# Patient Record
Sex: Male | Born: 1965 | Race: Black or African American | Hispanic: No | Marital: Single | Smoking: Current every day smoker
Health system: Southern US, Community
[De-identification: ages and names within clinical notes are randomized; demographics above are authoritative.]

## PROBLEM LIST (undated history)

## (undated) DIAGNOSIS — I10 Essential (primary) hypertension: Secondary | ICD-10-CM

## (undated) DIAGNOSIS — I2510 Atherosclerotic heart disease of native coronary artery without angina pectoris: Secondary | ICD-10-CM

## (undated) DIAGNOSIS — I429 Cardiomyopathy, unspecified: Secondary | ICD-10-CM

## (undated) DIAGNOSIS — N186 End stage renal disease: Secondary | ICD-10-CM

## (undated) DIAGNOSIS — E119 Type 2 diabetes mellitus without complications: Secondary | ICD-10-CM

## (undated) DIAGNOSIS — Z95810 Presence of automatic (implantable) cardiac defibrillator: Secondary | ICD-10-CM

## (undated) DIAGNOSIS — Z992 Dependence on renal dialysis: Secondary | ICD-10-CM

## (undated) DIAGNOSIS — E1140 Type 2 diabetes mellitus with diabetic neuropathy, unspecified: Secondary | ICD-10-CM

## (undated) HISTORY — PX: TRANSMETATARSAL AMPUTATION: SHX6197

## (undated) HISTORY — PX: INSERT / REPLACE / REMOVE PACEMAKER: SUR710

---

## 2015-11-04 ENCOUNTER — Encounter (HOSPITAL_COMMUNITY): Payer: Self-pay | Admitting: Internal Medicine

## 2016-01-08 ENCOUNTER — Encounter (HOSPITAL_COMMUNITY): Payer: Self-pay | Admitting: Emergency Medicine

## 2016-01-08 ENCOUNTER — Emergency Department (HOSPITAL_COMMUNITY): Payer: Medicare Other

## 2016-01-08 ENCOUNTER — Inpatient Hospital Stay (HOSPITAL_COMMUNITY)
Admission: EM | Admit: 2016-01-08 | Discharge: 2016-01-13 | DRG: 871 | Disposition: A | Discharge status: Skilled Nursing Facility | Payer: Medicare Other | Attending: Internal Medicine | Admitting: Internal Medicine

## 2016-01-08 DIAGNOSIS — A4102 Sepsis due to Methicillin resistant Staphylococcus aureus: Secondary | ICD-10-CM | POA: Diagnosis present

## 2016-01-08 DIAGNOSIS — I120 Hypertensive chronic kidney disease with stage 5 chronic kidney disease or end stage renal disease: Secondary | ICD-10-CM | POA: Diagnosis present

## 2016-01-08 DIAGNOSIS — A419 Sepsis, unspecified organism: Secondary | ICD-10-CM | POA: Diagnosis not present

## 2016-01-08 DIAGNOSIS — N186 End stage renal disease: Secondary | ICD-10-CM

## 2016-01-08 DIAGNOSIS — E1122 Type 2 diabetes mellitus with diabetic chronic kidney disease: Secondary | ICD-10-CM | POA: Diagnosis present

## 2016-01-08 DIAGNOSIS — I739 Peripheral vascular disease, unspecified: Secondary | ICD-10-CM | POA: Diagnosis present

## 2016-01-08 DIAGNOSIS — I10 Essential (primary) hypertension: Secondary | ICD-10-CM | POA: Diagnosis present

## 2016-01-08 DIAGNOSIS — R509 Fever, unspecified: Secondary | ICD-10-CM | POA: Diagnosis present

## 2016-01-08 DIAGNOSIS — E1140 Type 2 diabetes mellitus with diabetic neuropathy, unspecified: Secondary | ICD-10-CM | POA: Diagnosis present

## 2016-01-08 DIAGNOSIS — B9561 Methicillin susceptible Staphylococcus aureus infection as the cause of diseases classified elsewhere: Secondary | ICD-10-CM | POA: Diagnosis present

## 2016-01-08 DIAGNOSIS — D649 Anemia, unspecified: Secondary | ICD-10-CM | POA: Diagnosis present

## 2016-01-08 DIAGNOSIS — D696 Thrombocytopenia, unspecified: Secondary | ICD-10-CM | POA: Diagnosis present

## 2016-01-08 DIAGNOSIS — R7881 Bacteremia: Secondary | ICD-10-CM | POA: Diagnosis present

## 2016-01-08 DIAGNOSIS — Z833 Family history of diabetes mellitus: Secondary | ICD-10-CM | POA: Diagnosis not present

## 2016-01-08 DIAGNOSIS — I2510 Atherosclerotic heart disease of native coronary artery without angina pectoris: Secondary | ICD-10-CM | POA: Diagnosis present

## 2016-01-08 DIAGNOSIS — IMO0001 Reserved for inherently not codable concepts without codable children: Secondary | ICD-10-CM

## 2016-01-08 DIAGNOSIS — E119 Type 2 diabetes mellitus without complications: Secondary | ICD-10-CM | POA: Diagnosis not present

## 2016-01-08 DIAGNOSIS — Z95810 Presence of automatic (implantable) cardiac defibrillator: Secondary | ICD-10-CM | POA: Diagnosis not present

## 2016-01-08 DIAGNOSIS — Z992 Dependence on renal dialysis: Secondary | ICD-10-CM

## 2016-01-08 DIAGNOSIS — Z89432 Acquired absence of left foot: Secondary | ICD-10-CM

## 2016-01-08 DIAGNOSIS — I340 Nonrheumatic mitral (valve) insufficiency: Secondary | ICD-10-CM | POA: Diagnosis not present

## 2016-01-08 DIAGNOSIS — Z89431 Acquired absence of right foot: Secondary | ICD-10-CM | POA: Diagnosis not present

## 2016-01-08 DIAGNOSIS — F17210 Nicotine dependence, cigarettes, uncomplicated: Secondary | ICD-10-CM | POA: Diagnosis present

## 2016-01-08 DIAGNOSIS — I429 Cardiomyopathy, unspecified: Secondary | ICD-10-CM | POA: Diagnosis present

## 2016-01-08 DIAGNOSIS — Z720 Tobacco use: Secondary | ICD-10-CM | POA: Diagnosis not present

## 2016-01-08 DIAGNOSIS — Z794 Long term (current) use of insulin: Secondary | ICD-10-CM | POA: Diagnosis not present

## 2016-01-08 HISTORY — DX: Dependence on renal dialysis: Z99.2

## 2016-01-08 HISTORY — DX: End stage renal disease: N18.6

## 2016-01-08 HISTORY — DX: Essential (primary) hypertension: I10

## 2016-01-08 HISTORY — DX: Type 2 diabetes mellitus with diabetic neuropathy, unspecified: E11.40

## 2016-01-08 HISTORY — DX: Presence of automatic (implantable) cardiac defibrillator: Z95.810

## 2016-01-08 HISTORY — DX: Type 2 diabetes mellitus without complications: E11.9

## 2016-01-08 HISTORY — DX: Cardiomyopathy, unspecified: I42.9

## 2016-01-08 HISTORY — DX: Atherosclerotic heart disease of native coronary artery without angina pectoris: I25.10

## 2016-01-08 LAB — COMPREHENSIVE METABOLIC PANEL
ALK PHOS: 137 U/L — AB (ref 38–126)
ALT: 17 U/L (ref 17–63)
AST: 28 U/L (ref 15–41)
Albumin: 3.5 g/dL (ref 3.5–5.0)
Anion gap: 14 (ref 5–15)
BUN: 32 mg/dL — AB (ref 6–20)
CALCIUM: 10.1 mg/dL (ref 8.9–10.3)
CHLORIDE: 89 mmol/L — AB (ref 101–111)
CO2: 32 mmol/L (ref 22–32)
CREATININE: 5 mg/dL — AB (ref 0.61–1.24)
GFR, EST AFRICAN AMERICAN: 14 mL/min — AB (ref >60)
GFR, EST NON AFRICAN AMERICAN: 12 mL/min — AB (ref >60)
Glucose, Bld: 192 mg/dL — ABNORMAL HIGH (ref 65–99)
Potassium: 4.7 mmol/L (ref 3.5–5.1)
Sodium: 135 mmol/L (ref 135–145)
Total Bilirubin: 0.9 mg/dL (ref 0.3–1.2)
Total Protein: 9.2 g/dL — ABNORMAL HIGH (ref 6.5–8.1)

## 2016-01-08 LAB — URINE MICROSCOPIC-ADD ON

## 2016-01-08 LAB — LACTIC ACID, PLASMA: LACTIC ACID, VENOUS: 1.5 mmol/L (ref 0.5–2.0)

## 2016-01-08 LAB — CBC WITH DIFFERENTIAL/PLATELET
BASOS PCT: 0 %
Basophils Absolute: 0 10*3/uL (ref 0.0–0.1)
EOS ABS: 0 10*3/uL (ref 0.0–0.7)
EOS PCT: 0 %
HCT: 43.4 % (ref 39.0–52.0)
Hemoglobin: 15 g/dL (ref 13.0–17.0)
LYMPHS ABS: 0.9 10*3/uL (ref 0.7–4.0)
Lymphocytes Relative: 8 %
MCH: 32.7 pg (ref 26.0–34.0)
MCHC: 34.6 g/dL (ref 30.0–36.0)
MCV: 94.6 fL (ref 78.0–100.0)
Monocytes Absolute: 0.7 10*3/uL (ref 0.1–1.0)
Monocytes Relative: 6 %
Neutro Abs: 9.6 10*3/uL — ABNORMAL HIGH (ref 1.7–7.7)
Neutrophils Relative %: 85 %
Platelets: 141 10*3/uL — ABNORMAL LOW (ref 150–400)
RBC: 4.59 MIL/uL (ref 4.22–5.81)
RDW: 17.3 % — AB (ref 11.5–15.5)
WBC: 11.2 10*3/uL — AB (ref 4.0–10.5)

## 2016-01-08 LAB — APTT: APTT: 35 s (ref 24–37)

## 2016-01-08 LAB — URINALYSIS, ROUTINE W REFLEX MICROSCOPIC
Bilirubin Urine: NEGATIVE
Glucose, UA: 100 mg/dL — AB
KETONES UR: NEGATIVE mg/dL
LEUKOCYTES UA: NEGATIVE
NITRITE: NEGATIVE
PH: 6 (ref 5.0–8.0)
Protein, ur: 100 mg/dL — AB
SPECIFIC GRAVITY, URINE: 1.01 (ref 1.005–1.030)

## 2016-01-08 LAB — CBG MONITORING, ED: Glucose-Capillary: 159 mg/dL — ABNORMAL HIGH (ref 65–99)

## 2016-01-08 LAB — PROTIME-INR
INR: 1.16 (ref 0.00–1.49)
Prothrombin Time: 15 seconds (ref 11.6–15.2)

## 2016-01-08 LAB — I-STAT CG4 LACTIC ACID, ED
Lactic Acid, Venous: 3.36 mmol/L (ref 0.5–2.0)
Lactic Acid, Venous: 2.05 mmol/L (ref 0.5–2.0)

## 2016-01-08 LAB — GLUCOSE, CAPILLARY: Glucose-Capillary: 134 mg/dL — ABNORMAL HIGH (ref 65–99)

## 2016-01-08 LAB — PROCALCITONIN: Procalcitonin: 2.44 ng/mL

## 2016-01-08 MED ORDER — PIPERACILLIN SOD-TAZOBACTAM SO 2.25 (2-0.25) G IV SOLR
2.2500 g | Freq: Three times a day (TID) | INTRAVENOUS | Status: DC
  Administered 2016-01-08 – 2016-01-09 (×3): 2.25 g via INTRAVENOUS
  Filled 2016-01-08 (×13): qty 2.25

## 2016-01-08 MED ORDER — INSULIN ASPART 100 UNIT/ML ~~LOC~~ SOLN
0.0000 [IU] | Freq: Three times a day (TID) | SUBCUTANEOUS | Status: DC
  Administered 2016-01-09: 1 [IU] via SUBCUTANEOUS

## 2016-01-08 MED ORDER — SODIUM CHLORIDE 0.9% FLUSH: 3.0000 mL | INTRAVENOUS | Status: DC | PRN

## 2016-01-08 MED ORDER — GABAPENTIN 100 MG PO CAPS
100.0000 mg | ORAL_CAPSULE | Freq: Every day | ORAL | Status: DC
  Administered 2016-01-09 – 2016-01-13 (×5): 100 mg via ORAL
  Filled 2016-01-08 (×6): qty 1

## 2016-01-08 MED ORDER — ATORVASTATIN CALCIUM 10 MG PO TABS
10.0000 mg | ORAL_TABLET | Freq: Every day | ORAL | Status: DC
  Administered 2016-01-09 – 2016-01-13 (×5): 10 mg via ORAL
  Filled 2016-01-08 (×6): qty 1

## 2016-01-08 MED ORDER — ISOSORBIDE DINITRATE 20 MG PO TABS
10.0000 mg | ORAL_TABLET | Freq: Two times a day (BID) | ORAL | Status: DC
  Administered 2016-01-09 – 2016-01-13 (×4): 10 mg via ORAL
  Filled 2016-01-08 (×7): qty 1

## 2016-01-08 MED ORDER — SODIUM CHLORIDE 0.9% FLUSH
3.0000 mL | Freq: Two times a day (BID) | INTRAVENOUS | Status: DC
  Administered 2016-01-08 – 2016-01-11 (×5): 3 mL via INTRAVENOUS

## 2016-01-08 MED ORDER — ONDANSETRON HCL 4 MG PO TABS: 4.0000 mg | ORAL_TABLET | Freq: Four times a day (QID) | ORAL | Status: DC | PRN

## 2016-01-08 MED ORDER — PIPERACILLIN-TAZOBACTAM 3.375 G IVPB 30 MIN: 3.3750 g | Freq: Once | INTRAVENOUS | Status: DC

## 2016-01-08 MED ORDER — ONDANSETRON HCL 4 MG/2ML IJ SOLN
4.0000 mg | Freq: Four times a day (QID) | INTRAMUSCULAR | Status: DC | PRN
Start: 2016-01-08 — End: 2016-01-13

## 2016-01-08 MED ORDER — TRAZODONE HCL 50 MG PO TABS
50.0000 mg | ORAL_TABLET | Freq: Every day | ORAL | Status: DC
  Administered 2016-01-09 – 2016-01-12 (×5): 50 mg via ORAL
  Filled 2016-01-08 (×5): qty 1

## 2016-01-08 MED ORDER — VANCOMYCIN HCL IN DEXTROSE 1-5 GM/200ML-% IV SOLN: 1000.0000 mg | Freq: Once | INTRAVENOUS | Status: DC

## 2016-01-08 MED ORDER — FAMOTIDINE 20 MG PO TABS
20.0000 mg | ORAL_TABLET | Freq: Every day | ORAL | Status: DC
  Administered 2016-01-09 – 2016-01-13 (×5): 20 mg via ORAL
  Filled 2016-01-08 (×6): qty 1

## 2016-01-08 MED ORDER — ACETAMINOPHEN 500 MG PO TABS
1000.0000 mg | ORAL_TABLET | Freq: Once | ORAL | Status: DC
  Filled 2016-01-08: qty 2

## 2016-01-08 MED ORDER — HEPARIN SODIUM (PORCINE) 5000 UNIT/ML IJ SOLN
5000.0000 [IU] | Freq: Three times a day (TID) | INTRAMUSCULAR | Status: DC
  Administered 2016-01-09 – 2016-01-13 (×12): 5000 [IU] via SUBCUTANEOUS
  Filled 2016-01-08 (×13): qty 1

## 2016-01-08 MED ORDER — SODIUM CHLORIDE 0.9% FLUSH
3.0000 mL | Freq: Two times a day (BID) | INTRAVENOUS | Status: DC
  Administered 2016-01-08 – 2016-01-11 (×3): 3 mL via INTRAVENOUS

## 2016-01-08 MED ORDER — CARVEDILOL 3.125 MG PO TABS: 6.2500 mg | ORAL_TABLET | Freq: Two times a day (BID) | ORAL | Status: DC

## 2016-01-08 MED ORDER — SODIUM CHLORIDE 0.9 % IV BOLUS (SEPSIS)
1000.0000 mL | INTRAVENOUS | Status: AC
  Administered 2016-01-08 (×2): 1000 mL via INTRAVENOUS

## 2016-01-08 MED ORDER — POLYETHYLENE GLYCOL 3350 17 G PO PACK
17.0000 g | PACK | Freq: Every day | ORAL | Status: DC | PRN
  Filled 2016-01-08: qty 1

## 2016-01-08 MED ORDER — HYDRALAZINE HCL 25 MG PO TABS
25.0000 mg | ORAL_TABLET | Freq: Three times a day (TID) | ORAL | Status: DC
Start: 2016-01-08 — End: 2016-01-13
  Administered 2016-01-09 – 2016-01-12 (×4): 25 mg via ORAL
  Filled 2016-01-08 (×8): qty 1

## 2016-01-08 MED ORDER — VANCOMYCIN HCL 10 G IV SOLR
2000.0000 mg | Freq: Once | INTRAVENOUS | Status: AC
  Administered 2016-01-08: 2000 mg via INTRAVENOUS
  Filled 2016-01-08: qty 2000

## 2016-01-08 MED ORDER — SEVELAMER CARBONATE 800 MG PO TABS
2400.0000 mg | ORAL_TABLET | Freq: Three times a day (TID) | ORAL | Status: DC
  Administered 2016-01-09 – 2016-01-13 (×8): 2400 mg via ORAL
  Filled 2016-01-08 (×9): qty 3

## 2016-01-08 MED ORDER — PANTOPRAZOLE SODIUM 40 MG PO TBEC
40.0000 mg | DELAYED_RELEASE_TABLET | Freq: Every day | ORAL | Status: DC
  Administered 2016-01-09 – 2016-01-13 (×4): 40 mg via ORAL
  Filled 2016-01-08 (×5): qty 1

## 2016-01-08 MED ORDER — SODIUM CHLORIDE 0.9 % IV SOLN: 250.0000 mL | INTRAVENOUS | Status: DC | PRN

## 2016-01-08 MED ORDER — PRASUGREL HCL 10 MG PO TABS
10.0000 mg | ORAL_TABLET | Freq: Every day | ORAL | Status: DC
  Administered 2016-01-09 – 2016-01-13 (×4): 10 mg via ORAL
  Filled 2016-01-08 (×6): qty 1

## 2016-01-08 MED ORDER — ACETAMINOPHEN 650 MG RE SUPP: 650.0000 mg | Freq: Four times a day (QID) | RECTAL | Status: DC | PRN

## 2016-01-08 MED ORDER — ACETAMINOPHEN 325 MG PO TABS
650.0000 mg | ORAL_TABLET | Freq: Four times a day (QID) | ORAL | Status: DC | PRN
Start: 2016-01-08 — End: 2016-01-13
  Administered 2016-01-08: 650 mg via ORAL
  Filled 2016-01-08: qty 2

## 2016-01-08 MED ORDER — SODIUM CHLORIDE 0.9 % IV BOLUS (SEPSIS)
500.0000 mL | INTRAVENOUS | Status: AC
  Administered 2016-01-08: 500 mL via INTRAVENOUS

## 2016-01-08 MED ORDER — CARVEDILOL 3.125 MG PO TABS
6.2500 mg | ORAL_TABLET | Freq: Two times a day (BID) | ORAL | Status: DC
  Administered 2016-01-12 – 2016-01-13 (×2): 6.25 mg via ORAL
  Filled 2016-01-08 (×4): qty 2

## 2016-01-08 MED ORDER — FENTANYL CITRATE (PF) 100 MCG/2ML IJ SOLN
100.0000 ug | Freq: Once | INTRAMUSCULAR | Status: AC
  Administered 2016-01-08: 100 ug via INTRAVENOUS
  Filled 2016-01-08: qty 2

## 2016-01-08 MED ORDER — OXYCODONE HCL 5 MG PO TABS
10.0000 mg | ORAL_TABLET | Freq: Four times a day (QID) | ORAL | Status: DC | PRN
  Administered 2016-01-09 – 2016-01-11 (×8): 10 mg via ORAL
  Filled 2016-01-08 (×8): qty 2

## 2016-01-08 MED ORDER — BISACODYL 10 MG RE SUPP: 10.0000 mg | Freq: Every day | RECTAL | Status: DC | PRN

## 2016-01-08 NOTE — ED Notes (Signed)
Pt refusing IV at this time.  Dr. Ethelda Chick aware.

## 2016-01-08 NOTE — Progress Notes (Signed)
Pharmacy Note:  Initial antibiotics for Vancomycin and Zosyn ordered by EDP for SEPSIS/SIRS.  Estimated Creatinine Clearance: 21.9 mL/min (by C-G formula based on Cr of 5).   No Known Allergies  Filed Vitals:   01/08/16 1643 01/08/16 1922  BP:  103/64  Pulse:  111  Temp:    Resp: 18 16    Anti-infectives    Start     Dose/Rate Route Frequency Ordered Stop   01/08/16 1730  vancomycin (VANCOCIN) 2,000 mg in sodium chloride 0.9 % 500 mL IVPB     2,000 mg 250 mL/hr over 120 Minutes Intravenous  Once 01/08/16 1711     01/08/16 1730  piperacillin-tazobactam (ZOSYN) 2.25 g in dextrose 5 % 50 mL IVPB     2.25 g 100 mL/hr over 30 Minutes Intravenous 3 times per day 01/08/16 1719     01/08/16 1715  piperacillin-tazobactam (ZOSYN) IVPB 3.375 g  Status:  Discontinued     3.375 g 100 mL/hr over 30 Minutes Intravenous  Once 01/08/16 1706 01/08/16 1719   01/08/16 1715  vancomycin (VANCOCIN) IVPB 1000 mg/200 mL premix  Status:  Discontinued     1,000 mg 200 mL/hr over 60 Minutes Intravenous  Once 01/08/16 1706 01/08/16 1711      Plan: Initial doses of Vancomycin 2gm  X 1 ordered. Zosyn 2.25gm IV every 8 hours. F/U admission orders for further dosing if therapy continued.  Victor White, [facility] 01/08/2016 7:33 PM

## 2016-01-08 NOTE — ED Notes (Signed)
Per EMS: patient was at dialysis today presenting with flu like symptoms, cough, and fever. Patient denies sore throat, N/V/D. Patient states that he finished dialysis.  EMS states that dialysis nurse confirms that patient finished dialysis.

## 2016-01-08 NOTE — H&P (Signed)
[facility] History and Physical  [name] JXB:[id number] DOB: 1966/03/06 DOA: 01/08/2016  Referring physician: ED physician PCP: No primary care provider on file.  Specialists:  Dr. Lowell Guitar (nephrology)   Chief Complaint:  Fever, left foot pain   HPI: Victor White is a 50 y.o. male with PMH of insulin-dependent diabetes mellitus, hypertension, and end-stage renal disease on hemodialysis who presents from his dialysis center with fevers and left foot pain. Patient recently moved here from [place], [place] and has not yet established with a PCP. He notes fatigue, chills, and exquisite tenderness to the left foot for the past 3 days, but had otherwise been in his usual state. He denies chest pain, palpitations, cough, or dyspnea. Patient still makes urine but denies dysuria, hematuria, or increased urinary urgency or frequency. He is status post bilateral transmetatarsal amputations and notes severe pain at the left foot but denies the presence of any associated wound. He denies sick contacts and has not had recent antibiotic exposure. While he endorses chills, he denies any subjective fever per se. He presented for his regularly scheduled dialysis session and was noted to be febrile but reportedly completed dialysis, otherwise without incident.  In ED, patient was found to be febrile to 39.5 C, tachycardic to the 110s, and with vitals otherwise stable. Chest x-ray was negative for acute cardiopulmonary disease and radiographs of the left foot were negative for acute changes. EKG demonstrated a sinus tachycardia with LVH by voltage criteria. CBC features a leukocytosis to 11,200 and lactic acid is elevated to 3.36. Urine was collected and analysis reveals few bacteria with negative nitrite and negative leukocyte. Patient was bolused with 1.5 L of normal saline in the emergency department and treated with empiric vancomycin and Zosyn after blood and urine cultures have been collected. He  remained hemodynamically stable in the ED and will be admitted for ongoing evaluation and management of sepsis with source not yet identified.   Where does patient live?   At home     Can patient participate in ADLs?  Yes       Review of Systems:   General: no fevers, sweats, weight change, or poor appetite. Chills, fatigue  HEENT: no blurry vision, hearing changes or sore throat Pulm: no dyspnea, cough, or wheeze CV: no chest pain or palpitations Abd: no nausea, vomiting, abdominal pain, diarrhea, or constipation GU: no dysuria, hematuria, increased urinary frequency, or urgency  Ext: no leg edema Neuro: no focal weakness, numbness, or tingling, no vision change or hearing loss Skin: no rash, no wounds MSK: No muscle spasm, no deformity, no red, hot, or swollen joint. Left foot pain Heme: No easy bruising or bleeding Travel history: No recent long distant travel    Allergy: No Known Allergies  Past Medical History  Diagnosis Date  . Diabetes mellitus without complication (HCC)   . Hypertension   . Renal disorder   . AICD (automatic cardioverter/defibrillator) present     Past Surgical History  Procedure Laterality Date  . Insert / replace / remove pacemaker      Social History:  reports that he has been smoking Cigarettes.  He does not have any smokeless tobacco history on file. He reports that he does not drink alcohol or use illicit drugs.  Family History:  Family History  Problem Relation Age of Onset  . Diabetes type II Other      Prior to Admission medications   Medication Sig Start Date End Date Taking? Authorizing Provider  atorvastatin (LIPITOR) 10 MG tablet Take 10 mg by mouth daily.   Yes Historical Provider, MD  carvedilol (COREG) 6.25 MG tablet Take 6.25 mg by mouth 2 (two) times daily.   Yes Historical Provider, MD  gabapentin (NEURONTIN) 100 MG capsule Take 100 mg by mouth daily.   Yes Historical Provider, MD  HUMULIN R 100 UNIT/ML injection INJECT UNITS  AS DIRECTED PER BLOOD SUGAR 0-150= 0 UNITS, 151-200=...  (REFER TO PRESCRIPTION NOTES). 11/07/15  Yes Historical Provider, MD  hydrALAZINE (APRESOLINE) 25 MG tablet Take 25 mg by mouth 3 (three) times daily.   Yes Historical Provider, MD  isosorbide dinitrate (ISORDIL) 10 MG tablet Take 10 mg by mouth 2 (two) times daily.   Yes Historical Provider, MD  lidocaine-prilocaine (EMLA) cream APPLY SMALL AMOUNT TO ACCESS SITE 3 TIMES A WEEK 1 HOUR BEFORE DIALYSIS. COVER WITH OCCLUSIVE DRESSING (SARAN WRAP). 10/30/15  Yes Historical Provider, MD  Oxycodone HCl 10 MG TABS Take 10 mg by mouth daily.   Yes Historical Provider, MD  pantoprazole (PROTONIX) 40 MG tablet Take 40 mg by mouth daily.   Yes Historical Provider, MD  prasugrel (EFFIENT) 10 MG TABS tablet Take 10 mg by mouth daily.   Yes Historical Provider, MD  ranitidine (ZANTAC) 150 MG capsule Take 150 mg by mouth daily.   Yes Historical Provider, MD  RENVELA 800 MG tablet Take 2,400 mg by mouth 3 (three) times daily. 12/15/15  Yes Historical Provider, MD  traZODone (DESYREL) 50 MG tablet Take 50 mg by mouth at bedtime.   Yes Historical Provider, MD    Physical Exam: Filed Vitals:   01/08/16 2100 01/08/16 2111 01/08/16 2112 01/08/16 2130  BP:  99/66  99/64  Pulse: 98  103 102  Temp:      TempSrc:      Resp: 14   0  Height:      Weight:      SpO2:   96% 87%   General: Not in acute distress HEENT:       Eyes: PERRL, EOMI, no scleral icterus or conjunctival pallor.       ENT: No discharge from the ears or nose, no pharyngeal ulcers, petechiae or exudate.        Neck: No JVD, no bruit, no appreciable mass Heme: No cervical adenopathy, no pallor Cardiac: S1/S2, RRR, No murmurs, No gallops or rubs. Pulm: Good air movement bilaterally. No rales, wheezing, rhonchi or rubs. Abd: Soft, nondistended, nontender, no rebound pain or gaurding, no mass or organomegaly, BS present. Ext:  Status-post b/l transmetatarsal amputations, no LE edema. AVF in  RUE  Musculoskeletal: No red, hot, swollen joints. B/l TMAs, left foot exquisitely tender  Skin: No rashes or wounds on exposed surfaces  Neuro: Alert, oriented X3, cranial nerves II-XII grossly intact. No focal findings Psych: Patient is not overtly psychotic, appropriate mood and affect.  Labs on Admission:  Basic Metabolic Panel:  Recent Labs Lab 01/08/16 1715  NA 135  K 4.7  CL 89*  CO2 32  GLUCOSE 192*  BUN 32*  CREATININE 5.00*  CALCIUM 10.1   Liver Function Tests:  Recent Labs Lab 01/08/16 1715  AST 28  ALT 17  ALKPHOS 137*  BILITOT 0.9  PROT 9.2*  ALBUMIN 3.5   No results for input(s): LIPASE, AMYLASE in the last 168 hours. No results for input(s): AMMONIA in the last 168 hours. CBC:  Recent Labs Lab 01/08/16 1715  WBC 11.2*  NEUTROABS 9.6*  HGB 15.0  HCT  43.4  MCV 94.6  PLT 141*   Cardiac Enzymes: No results for input(s): CKTOTAL, CKMB, CKMBINDEX, TROPONINI in the last 168 hours.  BNP (last 3 results) No results for input(s): BNP in the last 8760 hours.  ProBNP (last 3 results) No results for input(s): PROBNP in the last 8760 hours.  CBG: No results for input(s): GLUCAP in the last 168 hours.  Radiological Exams on Admission: Dg Chest 2 View  01/08/2016  CLINICAL DATA:  Dialysis patient. Patient had dialysis today with flu like symptoms with cough and fever. EXAM: CHEST  2 VIEW COMPARISON:  11/27/2005 FINDINGS: Cardiac silhouette is mildly enlarged. No mediastinal or hilar masses or evidence of adenopathy. Left anterior chest wall cardioverter-defibrillator is stable. Clear lungs.  No pleural effusion or pneumothorax. Bony thorax is intact. IMPRESSION: No acute cardiopulmonary disease. Electronically Signed   By: Amie Portland M.D.   On: 01/08/2016 19:15   Dg Foot Complete Left  01/08/2016  CLINICAL DATA:  Patient has cough and fever and pain. EXAM: LEFT FOOT - COMPLETE 3+ VIEW COMPARISON:  None. FINDINGS: Patient is status post prior  amputation of the mid to distal left foot. There is diffuse osteopenia. Plantar calcaneal spur is identified. IMPRESSION: Chronic changes of the left foot. 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 Electronically Signed   By: Sherian Rein M.D.   On: 01/08/2016 19:15    EKG: Independently reviewed.  Abnormal findings:  Sinus tachycardia (rate 119), LVH by voltage criteria   Assessment/Plan  1. Sepsis, source unknown  - Febrile to 39.5 C on arrival with HR 114, WBC 11,200, lactate 3.36  - CXR not revealing, UA appears negative, no wounds identified, no HA or neck stiffness, no GI complaints and abd exam benign  - No upper respiratory sxs present  - Blood and urine cultures incubating, will follow-up  - Received empiric vanc and Zosyn in ED, will continue these while continuing work-up and following cultures  - AICD present and potential source; no murmur and no tenderness over site in left chest  - Respiratory viral panel pending   2. ESRD on HD TTS  - Completed HD prior to arrival  - No indications for further dialysis at this time  - SLIV, strict I/Os, daily wts, fluid-restrict diet  - Plan for maintenance HD on 01/10/16   3. Insulin-dependent type II DM  - Uses Humalin only at home  - No A1c on file  - Hold Humalin and institute a low-intensity SSI correctional while inpt  - Check CBG with meals and qHS  - Carb-modified diet when appropriate    4. Hypertension  - At goal currently  - Cautiously continue home-dose Isosordil, Coreg   5. Thrombocytopenia  - Mild, uncertain etiology, platelets 141,000 on admission  - No petechiae or bleeding  - Cautiously continue VTE ppx with sq heparin  - Monitor   DVT ppx: SQ Heparin    Code Status: Full code Family Communication: None at bed side.              Disposition Plan: Admit to inpatient   Date of Service 01/08/2016    Briscoe Deutscher, MD [facility] Pager [phone]  If 7PM-7AM, please contact  night-coverage [email or web address] Password [facility] 01/08/2016, 9:47 PM

## 2016-01-08 NOTE — ED Provider Notes (Signed)
CSN: [id number]     Arrival date & time 01/08/16  1624 History   First MD Initiated Contact with Patient 01/08/16 1647     Chief Complaint  Patient presents with  . Shortness of Breath   Level V caveat patient poor historian and acuity of situation  (Consider location/radiation/quality/duration/timing/severity/associated sxs/prior Treatment) HPI Patient sent from dialysis. Noted to have fever. Planes only of left foot pain. He denies shortness of breath denies chest pain. Denies injury. Nothing makes pain better or worse. Left foot pain has been going on for 2 days. Had amputation of distal foot several months ago. Patient stayed for hemodialysis today missed last 30 minutes Past Medical History  Diagnosis Date  . Diabetes mellitus without complication (HCC)   . Hypertension   . Renal disorder    end-stage renal disease History reviewed. No pertinent past surgical history. No family history on file. Social History  Substance Use Topics  . Smoking status: Current Every Day Smoker  . Smokeless tobacco: None  . Alcohol Use: No   admits to marijuana use  Review of Systems  Unable to perform ROS: Acuity of condition  Musculoskeletal:       Left foot pain  Allergic/Immunologic: Positive for immunocompromised state.      Allergies  Review of patient's allergies indicates no known allergies.  Home Medications   Prior to Admission medications   Not on File   BP 114/68 mmHg  Pulse 118  Temp(Src) 103.1 F (39.5 C) (Oral)  Resp 18  Ht 6' (1.829 m)  Wt 225 lb (102.059 kg)  BMI 30.51 kg/m2  SpO2 93% Physical Exam  Constitutional: He is oriented to person, place, and time. He appears distressed.  Chronically and  acutely ill-appearing  HENT:  Head: Normocephalic and atraumatic.  Eyes: Conjunctivae are normal. Pupils are equal, round, and reactive to light.  Neck: Neck supple. No tracheal deviation present. No thyromegaly present.  Cardiovascular: Normal rate.   No murmur  heard. Tachycardic  Pulmonary/Chest: Effort normal and breath sounds normal.  Abdominal: Soft. Bowel sounds are normal. He exhibits no distension. There is no tenderness.  Genitourinary: Penis normal.  Musculoskeletal: Normal range of motion. He exhibits no edema or tenderness.  Bilateral lower extremities with distal foot amputations. Left lower extremity no redness or swelling. He is tender at the distal aspect of the foot. No crepitance and no skin changes or differential and temperature to contralateral foot. Right upper extremity with dialysis fistula with good thrill, nontender  Neurological: He is alert and oriented to person, place, and time. Coordination normal.  Skin: Skin is warm and dry. No rash noted.  Psychiatric: He has a normal mood and affect.  Nursing note and vitals reviewed.   ED Course  Procedures (including critical care time) Labs Review Labs Reviewed  CULTURE, BLOOD (ROUTINE X 2)  CULTURE, BLOOD (ROUTINE X 2)  URINE CULTURE  COMPREHENSIVE METABOLIC PANEL  CBC WITH DIFFERENTIAL/PLATELET  URINALYSIS, ROUTINE W REFLEX MICROSCOPIC (NOT AT [facility])  I-STAT CG4 LACTIC ACID, ED    Imaging Review No results found. I have personally reviewed and evaluated these images and lab results as part of my medical decision-making.   EKG Interpretation None     8:10 PM patient feels improved after treatment with intravenous fluids, antibiotics and intravenous fentanyl.  x-rays viewed by me Results for orders placed or performed during the hospital encounter of 01/08/16  Comprehensive metabolic panel  Result Value Ref Range   Sodium 135 135 - 145  mmol/L   Potassium 4.7 3.5 - 5.1 mmol/L   Chloride 89 (L) 101 - 111 mmol/L   CO2 32 22 - 32 mmol/L   Glucose, Bld 192 (H) 65 - 99 mg/dL   BUN 32 (H) 6 - 20 mg/dL   Creatinine, Ser 1.61 (H) 0.61 - 1.24 mg/dL   Calcium 09.6 8.9 - 04.5 mg/dL   Total Protein 9.2 (H) 6.5 - 8.1 g/dL   Albumin 3.5 3.5 - 5.0 g/dL   AST 28 15 - 41  U/L   ALT 17 17 - 63 U/L   Alkaline Phosphatase 137 (H) 38 - 126 U/L   Total Bilirubin 0.9 0.3 - 1.2 mg/dL   GFR calc non Af Amer 12 (L) >60 mL/min   GFR calc Af Amer 14 (L) >60 mL/min   Anion gap 14 5 - 15  CBC WITH DIFFERENTIAL  Result Value Ref Range   WBC 11.2 (H) 4.0 - 10.5 K/uL   RBC 4.59 4.22 - 5.81 MIL/uL   Hemoglobin 15.0 13.0 - 17.0 g/dL   HCT 40.9 81.1 - 91.4 %   MCV 94.6 78.0 - 100.0 fL   MCH 32.7 26.0 - 34.0 pg   MCHC 34.6 30.0 - 36.0 g/dL   RDW 78.2 (H) 95.6 - 21.3 %   Platelets 141 (L) 150 - 400 K/uL   Neutrophils Relative % 85 %   Neutro Abs 9.6 (H) 1.7 - 7.7 K/uL   Lymphocytes Relative 8 %   Lymphs Abs 0.9 0.7 - 4.0 K/uL   Monocytes Relative 6 %   Monocytes Absolute 0.7 0.1 - 1.0 K/uL   Eosinophils Relative 0 %   Eosinophils Absolute 0.0 0.0 - 0.7 K/uL   Basophils Relative 0 %   Basophils Absolute 0.0 0.0 - 0.1 K/uL  Urinalysis, Routine w reflex microscopic (not at [facility])  Result Value Ref Range   Color, Urine YELLOW YELLOW   APPearance CLEAR CLEAR   Specific Gravity, Urine 1.010 1.005 - 1.030   pH 6.0 5.0 - 8.0   Glucose, UA 100 (A) NEGATIVE mg/dL   Hgb urine dipstick TRACE (A) NEGATIVE   Bilirubin Urine NEGATIVE NEGATIVE   Ketones, ur NEGATIVE NEGATIVE mg/dL   Protein, ur 086 (A) NEGATIVE mg/dL   Nitrite NEGATIVE NEGATIVE   Leukocytes, UA NEGATIVE NEGATIVE  Urine microscopic-add on  Result Value Ref Range   Squamous Epithelial / LPF 0-5 (A) NONE SEEN   WBC, UA 0-5 0 - 5 WBC/hpf   RBC / HPF 0-5 0 - 5 RBC/hpf   Bacteria, UA FEW (A) NONE SEEN   Casts GRANULAR CAST (A) NEGATIVE  I-Stat CG4 Lactic Acid, ED  (not at  [facility])  Result Value Ref Range   Lactic Acid, Venous 3.36 (HH) 0.5 - 2.0 mmol/L   Comment NOTIFIED PHYSICIAN   I-Stat CG4 Lactic Acid, ED  Result Value Ref Range   Lactic Acid, Venous 2.05 (HH) 0.5 - 2.0 mmol/L   Comment NOTIFIED PHYSICIAN    Dg Chest 2 View  01/08/2016  CLINICAL DATA:  Dialysis patient. Patient had dialysis today with  flu like symptoms with cough and fever. EXAM: CHEST  2 VIEW COMPARISON:  11/27/2005 FINDINGS: Cardiac silhouette is mildly enlarged. No mediastinal or hilar masses or evidence of adenopathy. Left anterior chest wall cardioverter-defibrillator is stable. Clear lungs.  No pleural effusion or pneumothorax. Bony thorax is intact. IMPRESSION: No acute cardiopulmonary disease. Electronically Signed   By: Amie Portland M.D.   On: 01/08/2016 19:15  Dg Foot Complete Left  01/08/2016  CLINICAL DATA:  Patient has cough and fever and pain. EXAM: LEFT FOOT - COMPLETE 3+ VIEW COMPARISON:  None. 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 There is diffuse osteopenia. Plantar calcaneal spur is identified. IMPRESSION: Chronic changes of the left foot. 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 Electronically Signed   By: Sherian Rein M.D.   On: 01/08/2016 19:15    MDM  Code sepsis called based on Sirs criteria fever, tachycardia. Source of suspected infection unknown Final diagnoses:  None  Dr Antionette Char from hospitalist service consulted and will make arrangements for admission Patient meets clinica Flexion unknownl criteria for sepsis with at least 2 SIRS and suspected source of  Infection unknown presently.  Diagnoses #1 sepsis #2hyperglycemia   CRITICAL CARE Performed by: Doug Sou Total critical care time: 35 minutes Critical care time was exclusive of separately billable procedures and treating other patients. Critical care was necessary to treat or prevent imminent or life-threatening deterioration. Critical care was time spent personally by me on the following activities: development of treatment plan with patient and/or surrogate as well as nursing, discussions with consultants, evaluation of patient's response to treatment, examination of patient, obtaining history from patient or surrogate, ordering and performing treatments and interventions,  ordering and review of laboratory studies, ordering and review of radiographic studies, pulse oximetry and re-evaluation of patient's condition.    Doug Sou, MD 01/08/16 2024

## 2016-01-09 DIAGNOSIS — A419 Sepsis, unspecified organism: Secondary | ICD-10-CM

## 2016-01-09 DIAGNOSIS — I10 Essential (primary) hypertension: Secondary | ICD-10-CM

## 2016-01-09 DIAGNOSIS — R7881 Bacteremia: Secondary | ICD-10-CM | POA: Diagnosis present

## 2016-01-09 LAB — CBC WITH DIFFERENTIAL/PLATELET
Basophils Absolute: 0 10*3/uL (ref 0.0–0.1)
Basophils Relative: 0 %
EOS ABS: 0 10*3/uL (ref 0.0–0.7)
EOS PCT: 0 %
HEMATOCRIT: 37.6 % — AB (ref 39.0–52.0)
Hemoglobin: 12.8 g/dL — ABNORMAL LOW (ref 13.0–17.0)
LYMPHS ABS: 1.3 10*3/uL (ref 0.7–4.0)
LYMPHS PCT: 9 %
MCH: 32.5 pg (ref 26.0–34.0)
MCHC: 34 g/dL (ref 30.0–36.0)
MCV: 95.4 fL (ref 78.0–100.0)
MONO ABS: 1 10*3/uL (ref 0.1–1.0)
MONOS PCT: 7 %
Neutro Abs: 12.3 10*3/uL — ABNORMAL HIGH (ref 1.7–7.7)
Neutrophils Relative %: 84 %
Platelets: 115 10*3/uL — ABNORMAL LOW (ref 150–400)
RBC: 3.94 MIL/uL — AB (ref 4.22–5.81)
RDW: 17.5 % — AB (ref 11.5–15.5)
Smear Review: ADEQUATE
WBC: 14.6 10*3/uL — AB (ref 4.0–10.5)

## 2016-01-09 LAB — GLUCOSE, CAPILLARY
GLUCOSE-CAPILLARY: 128 mg/dL — AB (ref 65–99)
Glucose-Capillary: 126 mg/dL — ABNORMAL HIGH (ref 65–99)
Glucose-Capillary: 179 mg/dL — ABNORMAL HIGH (ref 65–99)
Glucose-Capillary: 129 mg/dL — ABNORMAL HIGH (ref 65–99)
Glucose-Capillary: 153 mg/dL — ABNORMAL HIGH (ref 65–99)

## 2016-01-09 LAB — BASIC METABOLIC PANEL
Anion gap: 13 (ref 5–15)
BUN: 41 mg/dL — AB (ref 6–20)
CHLORIDE: 95 mmol/L — AB (ref 101–111)
CO2: 26 mmol/L (ref 22–32)
CREATININE: 5.66 mg/dL — AB (ref 0.61–1.24)
Calcium: 9 mg/dL (ref 8.9–10.3)
GFR calc Af Amer: 12 mL/min — ABNORMAL LOW (ref >60)
GFR calc non Af Amer: 11 mL/min — ABNORMAL LOW (ref >60)
GLUCOSE: 157 mg/dL — AB (ref 65–99)
POTASSIUM: 3.5 mmol/L (ref 3.5–5.1)
Sodium: 134 mmol/L — ABNORMAL LOW (ref 135–145)

## 2016-01-09 LAB — INFLUENZA PANEL BY PCR (TYPE A & B)
H1N1FLUPCR: NOT DETECTED
INFLAPCR: NEGATIVE
Influenza B By PCR: NEGATIVE

## 2016-01-09 LAB — LACTIC ACID, PLASMA: Lactic Acid, Venous: 1.8 mmol/L (ref 0.5–2.0)

## 2016-01-09 LAB — MRSA PCR SCREENING: MRSA by PCR: POSITIVE — AB

## 2016-01-09 MED ORDER — VANCOMYCIN HCL IN DEXTROSE 750-5 MG/150ML-% IV SOLN
750.0000 mg | INTRAVENOUS | Status: DC | PRN
  Filled 2016-01-09: qty 150

## 2016-01-09 MED ORDER — VANCOMYCIN HCL IN DEXTROSE 750-5 MG/150ML-% IV SOLN
750.0000 mg | INTRAVENOUS | Status: DC
  Administered 2016-01-10 – 2016-01-13 (×2): 750 mg via INTRAVENOUS
  Filled 2016-01-09 (×2): qty 150

## 2016-01-09 NOTE — Progress Notes (Signed)
CRITICAL VALUE ALERT  Critical value received:  MRSA Positive  Date of notification:  01/09/16  Time of notification:  235  Critical value read back: yes  Nurse who received alert: D.Patrisha Hausmann RN  MD notified (1st page):  Dr Antionette Char  Time of first page:  0236  MD notified (2nd page):none  Time of second page:0000  Responding MD:  none  Time MD responded: 0000

## 2016-01-09 NOTE — Progress Notes (Signed)
CRITICAL VALUE ALERT  Critical value received:  Positive blood cultures - gram pos cocci in clusters          Date of notification:  01/09/16  Time of notification:  1015  Critical value read back:Yes.    Nurse who received alert:  Sherrye Payor RN  MD notified (1st page):  Dr. Ardyth Harps  Time of first page:  1105  MD notified (2nd page):  Time of second page:  Responding MD:  Dr. Ardyth Harps  Time MD responded:

## 2016-01-09 NOTE — Progress Notes (Signed)
BP meds held this AM d/t low BP

## 2016-01-09 NOTE — Progress Notes (Signed)
[facility] PROGRESS NOTE  Victor White WJX:[id number] DOB: [birth year] DOA: 01/08/2016 PCP: No primary care provider on file.  Assessment/Plan: Sepsis/gram-positive cocci bacteremia -Continue vancomycin, discontinue Zosyn. Can attempt to narrow antibiotics once sensitivities are available. -Unfortunately has an AICD and will need a TEE to make sure this has not been seeded.  End-stage renal disease -on hemodialysis Tuesday Thursday Saturday. -Discussed with nephrology need for dialysis as an inpatient.  Diabetes mellitus -Fair control, continue current regimen and adjust as needed.  Code Status: Full code Family Communication: Patient only  Disposition Plan: Back to SNF once ready   Consultants:  None   Antibiotics:  Vancomycin   Subjective: Patient says he feels fine other than some mild right elbow pain, he wants to leave the hospital and states he may not stay until next week to have his TEE performed.  Objective: Filed Vitals:   01/08/16 2301 01/09/16 0606 01/09/16 0851 01/09/16 1351  BP: 88/57  Pulse: 110 89 88 89  Temp: 103.1 F (39.5 C) 98.5 F (36.9 C)  98.2 F (36.8 C)  TempSrc: Oral Oral  Oral  Resp: Height:      Weight: 78.8 kg (173 lb 11.6 oz) 79.1 kg (174 lb 6.1 oz)    SpO2: 97% 96% 96% 97%    Intake/Output Summary (Last 24 hours) at 01/09/16 1535 Last data filed at 01/09/16 1300  Gross per 24 hour  Intake    480 ml  Output      0 ml  Net    480 ml   Filed Weights   01/08/16 1629 01/08/16 2301 01/09/16 0606  Weight: 102.059 kg (225 lb) 78.8 kg (173 lb 11.6 oz) 79.1 kg (174 lb 6.1 oz)    Exam:   General:  Alert, awake, oriented 3  Cardiovascular: Regular rate and rhythm  Respiratory: Clear to auscultation bilaterally  Abdomen: Soft, nontender, nondistended, positive bowel sounds  Extremities: No clubbing, cyanosis or edema   Neurologic:  Grossly intact and nonfocal  Data Reviewed: Basic  Metabolic Panel:  Recent Labs Lab 01/08/16 1715 01/09/16 0233  NA 135 134*  K 4.7 3.5  CL 89* 95*  CO2 32 26  GLUCOSE 192* 157*  BUN 32* 41*  CREATININE 5.00* 5.66*  CALCIUM 10.1 9.0   Liver Function Tests:  Recent Labs Lab 01/08/16 1715  AST 28  ALT 17  ALKPHOS 137*  BILITOT 0.9  PROT 9.2*  ALBUMIN 3.5   No results for input(s): LIPASE, AMYLASE in the last 168 hours. No results for input(s): AMMONIA in the last 168 hours. CBC:  Recent Labs Lab 01/08/16 1715 01/09/16 0233  WBC 11.2* 14.6*  NEUTROABS 9.6* 12.3*  HGB 15.0 12.8*  HCT 43.4 37.6*  MCV 94.6 95.4  PLT 141* 115*   Cardiac Enzymes: No results for input(s): CKTOTAL, CKMB, CKMBINDEX, TROPONINI in the last 168 hours. BNP (last 3 results) No results for input(s): BNP in the last 8760 hours.  ProBNP (last 3 results) No results for input(s): PROBNP in the last 8760 hours.  CBG:  Recent Labs Lab 01/08/16 2153 01/08/16 2311 01/09/16 0809 01/09/16 1135  GLUCAP 159* 134* 126* 179*    Recent Results (from the past 240 hour(s))  Blood Culture (routine x 2)     Status: None (Preliminary result)   Collection Time: 01/08/16  5:15 PM  Result Value Ref Range Status   Specimen Description BLOOD LEFT ANTECUBITAL DRAWN BY RN  Final  Special Requests BOTTLES DRAWN AEROBIC AND ANAEROBIC 6CC EACH  Final   Culture  Setup Time   Final    GRAM POSITIVE COCCI IN CLUSTERS Gram Stain Report Called to,Read Back By and Verified With: BULLIN,M AT 1009 ON 01/09/2016 BY WOODS,M ON BOTH AEROBIC AND ANAROBIC    Culture NO GROWTH < 12 HOURS  Final   Report Status PENDING  Incomplete  Blood Culture (routine x 2)     Status: None (Preliminary result)   Collection Time: 01/08/16  5:16 PM  Result Value Ref Range Status   Specimen Description BLOOD RIGHT HAND  Final   Special Requests BOTTLES DRAWN AEROBIC ONLY 8CC  Final   Culture  Setup Time   Final    GRAM POSITIVE COCCI IN CLUSTERS Gram Stain Report Called to,Read  Back By and Verified With: BULILNS, M. AT 1233 ON 01/09/2016 BY AGUNDIZ, E.    Culture PENDING  Incomplete   Report Status PENDING  Incomplete  Urine culture     Status: None (Preliminary result)   Collection Time: 01/08/16  5:19 PM  Result Value Ref Range Status   Specimen Description URINE, CLEAN CATCH  Final   Special Requests NONE  Final   Culture   Final    NO GROWTH < 24 HOURS Performed at [facility]    Report Status PENDING  Incomplete  MRSA PCR Screening     Status: Abnormal   Collection Time: 01/09/16 12:29 AM  Result Value Ref Range Status   MRSA by PCR POSITIVE (A) NEGATIVE Final    Comment:        The GeneXpert MRSA Assay (FDA approved for NASAL specimens only), is one component of a comprehensive MRSA colonization surveillance program. It is not intended to diagnose MRSA infection nor to guide or monitor treatment for MRSA infections. RESULT CALLED TO, READ BACK BY AND VERIFIED WITH: STURDIVANT D AT 0226 ON [id number] BY FORSYTH K      Studies: Dg Chest 2 View  01/08/2016  CLINICAL DATA:  Dialysis patient. Patient had dialysis today with flu like symptoms with cough and fever. EXAM: CHEST  2 VIEW COMPARISON:  11/27/2005 FINDINGS: Cardiac silhouette is mildly enlarged. No mediastinal or hilar masses or evidence of adenopathy. Left anterior chest wall cardioverter-defibrillator is stable. Clear lungs.  No pleural effusion or pneumothorax. Bony thorax is intact. IMPRESSION: No acute cardiopulmonary disease. Electronically Signed   By: Amie Portland M.D.   On: 01/08/2016 19:15   Dg Foot Complete Left  01/08/2016  CLINICAL DATA:  Patient has cough and fever and pain. EXAM: LEFT FOOT - COMPLETE 3+ VIEW COMPARISON:  None. 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 There is diffuse osteopenia. Plantar calcaneal spur is identified. IMPRESSION: Chronic changes of the left foot. 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 Electronically Signed   By: Sherian Rein M.D.   On: 01/08/2016 19:15    Scheduled Meds: . acetaminophen  1,000 mg Oral Once  . atorvastatin  10 mg Oral Daily  . carvedilol  6.25 mg Oral BID WC  . famotidine  20 mg Oral Daily  . gabapentin  100 mg Oral Daily  . heparin  5,000 Units Subcutaneous 3 times per day  . hydrALAZINE  25 mg Oral TID  . insulin aspart  0-9 Units Subcutaneous TID WC  . isosorbide dinitrate  10 mg Oral BID  . pantoprazole  40 mg Oral Daily  .  prasugrel  10 mg Oral Daily  . sevelamer carbonate  2,400 mg Oral TID  . sodium chloride flush  3 mL Intravenous Q12H  . sodium chloride flush  3 mL Intravenous Q12H  . traZODone  50 mg Oral QHS   Continuous Infusions:   Principal Problem:   Sepsis, unspecified organism (HCC) Active Problems:   ESRD (end stage renal disease) on dialysis (HCC)   Thrombocytopenia (HCC)   Insulin dependent diabetes mellitus (HCC)   Hypertension   Sepsis (HCC)    Time spent: 30 minutes. Greater than 50% of this time was spent in direct contact with the patient coordinating care.    Chaya Jan  [facility] Pager [phone]  If 7PM-7AM, please contact night-coverage at [email or web address], password [facility] 01/09/2016, 3:35 PM  LOS: 1 day

## 2016-01-09 NOTE — Care Management Note (Signed)
Case Management Note  Patient Details  Name: Victor White MRN: [id number] Date of Birth: [birth year]  Subjective/Objective:      Spoke with patient who answers questions appropriately.  Patient has dialysis TTS at [facility] on [address]  Uses [facility] ot get to appoinments and states he resides at [facility] in [place]. Has Wheelchair and access to medications.No Cm needs identified              Action/Plan: Return to [facility].  Expected Discharge Date:                  Expected Discharge Plan:  Skilled Nursing Facility  In-House Referral:  Clinical Social Work  Discharge planning Services  CM Consult  Post Acute Care Choice:    Choice offered to:     DME Arranged:    DME Agency:     HH Arranged:    HH Agency:     Status of Service:  Completed, signed off  Medicare Important Message Given:    Date Medicare IM Given:    Medicare IM give by:    Date Additional Medicare IM Given:    Additional Medicare Important Message give by:     If discussed at Long Length of Stay Meetings, dates discussed:    Additional Comments:  Adonis Huguenin, RN 01/09/2016, 5:15 PM

## 2016-01-10 DIAGNOSIS — Z95810 Presence of automatic (implantable) cardiac defibrillator: Secondary | ICD-10-CM | POA: Diagnosis present

## 2016-01-10 DIAGNOSIS — F17210 Nicotine dependence, cigarettes, uncomplicated: Secondary | ICD-10-CM | POA: Diagnosis present

## 2016-01-10 DIAGNOSIS — D649 Anemia, unspecified: Secondary | ICD-10-CM | POA: Diagnosis present

## 2016-01-10 LAB — BASIC METABOLIC PANEL
ANION GAP: 15 (ref 5–15)
BUN: 57 mg/dL — ABNORMAL HIGH (ref 6–20)
CALCIUM: 9.4 mg/dL (ref 8.9–10.3)
CO2: 27 mmol/L (ref 22–32)
CREATININE: 7.03 mg/dL — AB (ref 0.61–1.24)
Chloride: 93 mmol/L — ABNORMAL LOW (ref 101–111)
GFR, EST AFRICAN AMERICAN: 9 mL/min — AB (ref >60)
GFR, EST NON AFRICAN AMERICAN: 8 mL/min — AB (ref >60)
Glucose, Bld: 106 mg/dL — ABNORMAL HIGH (ref 65–99)
Potassium: 4 mmol/L (ref 3.5–5.1)
SODIUM: 135 mmol/L (ref 135–145)

## 2016-01-10 LAB — CBC
HCT: 37.7 % — ABNORMAL LOW (ref 39.0–52.0)
HEMOGLOBIN: 12.9 g/dL — AB (ref 13.0–17.0)
MCH: 32.3 pg (ref 26.0–34.0)
MCHC: 34.2 g/dL (ref 30.0–36.0)
MCV: 94.3 fL (ref 78.0–100.0)
PLATELETS: 125 10*3/uL — AB (ref 150–400)
RBC: 4 MIL/uL — AB (ref 4.22–5.81)
RDW: 17.3 % — ABNORMAL HIGH (ref 11.5–15.5)
WBC: 10.6 10*3/uL — AB (ref 4.0–10.5)

## 2016-01-10 LAB — GLUCOSE, CAPILLARY
GLUCOSE-CAPILLARY: 101 mg/dL — AB (ref 65–99)
GLUCOSE-CAPILLARY: 181 mg/dL — AB (ref 65–99)
Glucose-Capillary: 101 mg/dL — ABNORMAL HIGH (ref 65–99)

## 2016-01-10 LAB — HEMOGLOBIN A1C
Hgb A1c MFr Bld: 6.7 % — ABNORMAL HIGH (ref 4.8–5.6)
MEAN PLASMA GLUCOSE: 146 mg/dL

## 2016-01-10 LAB — URINE CULTURE

## 2016-01-10 MED ORDER — LIDOCAINE HCL (PF) 1 % IJ SOLN: 5.0000 mL | INTRAMUSCULAR | Status: DC | PRN

## 2016-01-10 MED ORDER — PENTAFLUOROPROP-TETRAFLUOROETH EX AERO: 1.0000 "application " | INHALATION_SPRAY | CUTANEOUS | Status: DC | PRN

## 2016-01-10 MED ORDER — CEFAZOLIN SODIUM-DEXTROSE 2-3 GM-% IV SOLR
2.0000 g | INTRAVENOUS | Status: DC
  Administered 2016-01-10: 2 g via INTRAVENOUS
  Filled 2016-01-10: qty 50

## 2016-01-10 MED ORDER — SODIUM CHLORIDE 0.9 % IV SOLN: 100.0000 mL | INTRAVENOUS | Status: DC | PRN

## 2016-01-10 MED ORDER — HEPARIN SODIUM (PORCINE) 1000 UNIT/ML IJ SOLN
INTRAMUSCULAR | Status: AC
  Administered 2016-01-10: 1600 [IU] via INTRAVENOUS_CENTRAL
  Filled 2016-01-10: qty 2

## 2016-01-10 MED ORDER — LIDOCAINE-PRILOCAINE 2.5-2.5 % EX CREA: 1.0000 "application " | TOPICAL_CREAM | CUTANEOUS | Status: DC | PRN

## 2016-01-10 MED ORDER — HEPARIN SODIUM (PORCINE) 1000 UNIT/ML DIALYSIS
20.0000 [IU]/kg | INTRAMUSCULAR | Status: DC | PRN
  Administered 2016-01-10: 1600 [IU] via INTRAVENOUS_CENTRAL
  Filled 2016-01-10 (×2): qty 2

## 2016-01-10 NOTE — Consult Note (Signed)
[facility]    Date of Admission:  01/08/2016           Day 2 vancomycin       Reason for Consult: Automatic consultation for staph aureus bacteremia    Referring Physician: Dr. Ted Mcalpine  Principal Problem:   Staphylococcus aureus bacteremia Active Problems:   Sepsis (HCC)   ESRD (end stage renal disease) on dialysis (HCC)   Thrombocytopenia (HCC)   Insulin dependent diabetes mellitus (HCC)   Hypertension   Normocytic anemia   AICD (automatic cardioverter/defibrillator) present   Cigarette smoker   . acetaminophen  1,000 mg Oral Once  . atorvastatin  10 mg Oral Daily  . carvedilol  6.25 mg Oral BID WC  . famotidine  20 mg Oral Daily  . gabapentin  100 mg Oral Daily  . heparin  5,000 Units Subcutaneous 3 times per day  . hydrALAZINE  25 mg Oral TID  . insulin aspart  0-9 Units Subcutaneous TID WC  . isosorbide dinitrate  10 mg Oral BID  . pantoprazole  40 mg Oral Daily  . prasugrel  10 mg Oral Daily  . sevelamer carbonate  2,400 mg Oral TID  . sodium chloride flush  3 mL Intravenous Q12H  . sodium chloride flush  3 mL Intravenous Q12H  . traZODone  50 mg Oral QHS  . vancomycin  750 mg Intravenous Q T,Th,Sa-HD    Recommendations: 1. Continue vancomycin 2. Add cefazolin pending antibiotic susceptibility results 3. Repeat blood cultures 4. Transthoracic echocardiogram   Assessment: He has staph aureus bacteremia. He has defervesced on vancomycin. I will add cefazolin pending susceptibility results were optimal coverage of MRSA and MSSA. I will repeat blood cultures and order a transthoracic echocardiogram to get a look at his valves and the AICD leads. Previous notes indicate that he is threatening to leave the hospital before TEE can be performed. Dr. Kristian Covey noted that his fistula did not show any signs of inflammation. His been noted have a left foot and right elbow pain but it is not clear to me that there is any  evidence of active infection at those sites.   HPI: Victor White is a 50 y.o. male with diabetes, end-stage renal disease on hemodialysis and underlying heart disease requiring an implanted defibrillator. He recently moved here from [place]. He presented to the hospital 48 hours ago with fever and left foot pain. Admission blood cultures are growing staph aureus.   Review of Systems: Review of Systems  Unable to perform ROS Constitutional:       This is a remote consultation so no review of systems or physical exam was performed.    Past Medical History  Diagnosis Date  . Diabetes mellitus without complication (HCC)   . Hypertension   . Renal disorder   . AICD (automatic cardioverter/defibrillator) present     Social History  Substance Use Topics  . Smoking status: Current Every Day Smoker    Types: Cigarettes  . Smokeless tobacco: None  . Alcohol Use: No    Family History  Problem Relation Age of Onset  . Diabetes type II Other    No Known Allergies  OBJECTIVE: Blood pressure 105/65, pulse 87, temperature 98.1 F (36.7 C), temperature source Oral, resp. rate 18, height 6' (1.829 m), weight 174 lb 6.1 oz (79.1 kg), SpO2 97 %.  Physical Exam  Lab Results Lab Results  Component Value Date  WBC 10.6* 01/10/2016   HGB 12.9* 01/10/2016   HCT 37.7* 01/10/2016   MCV 94.3 01/10/2016   PLT 125* 01/10/2016    Lab Results  Component Value Date   CREATININE 7.03* 01/10/2016   BUN 57* 01/10/2016   NA 135 01/10/2016   K 4.0 01/10/2016   CL 93* 01/10/2016   CO2 27 01/10/2016    Lab Results  Component Value Date   ALT 17 01/08/2016   AST 28 01/08/2016   ALKPHOS 137* 01/08/2016   BILITOT 0.9 01/08/2016     Microbiology: Recent Results (from the past 240 hour(s))  Blood Culture (routine x 2)     Status: None (Preliminary result)   Collection Time: 01/08/16  5:15 PM  Result Value Ref Range Status   Specimen Description BLOOD LEFT ANTECUBITAL DRAWN BY RN  Final    Special Requests BOTTLES DRAWN AEROBIC AND ANAEROBIC 6CC EACH  Final   Culture  Setup Time   Final    GRAM POSITIVE COCCI IN CLUSTERS IN BOTH AEROBIC AND ANAEROBIC BOTTLES CRITICAL RESULT CALLED TO, READ BACK BY AND VERIFIED WITH: BULLIN,M AT 1009 ON 01/09/2016 BY WOODS,M    Culture   Final    STAPHYLOCOCCUS AUREUS Performed at [facility]    Report Status PENDING  Incomplete  Blood Culture (routine x 2)     Status: None (Preliminary result)   Collection Time: 01/08/16  5:16 PM  Result Value Ref Range Status   Specimen Description BLOOD RIGHT HAND  Final   Special Requests BOTTLES DRAWN AEROBIC ONLY 8CC  Final   Culture  Setup Time   Final    GRAM POSITIVE COCCI IN CLUSTERS AEROBIC BOTTLE ONLY CRITICAL RESULT CALLED TO, READ BACK BY AND VERIFIED WITH: BULILNS, M. AT 1233 ON 01/09/2016 BY AGUNDIZ, E.    Culture   Final    STAPHYLOCOCCUS AUREUS Performed at [facility]    Report Status PENDING  Incomplete  Urine culture     Status: None (Preliminary result)   Collection Time: 01/08/16  5:19 PM  Result Value Ref Range Status   Specimen Description URINE, CLEAN CATCH  Final   Special Requests NONE  Final   Culture   Final    NO GROWTH < 24 HOURS Performed at [facility]    Report Status PENDING  Incomplete  MRSA PCR Screening     Status: Abnormal   Collection Time: 01/09/16 12:29 AM  Result Value Ref Range Status   MRSA by PCR POSITIVE (A) NEGATIVE Final    Comment:        The GeneXpert MRSA Assay (FDA approved for NASAL specimens only), is one component of a comprehensive MRSA colonization surveillance program. It is not intended to diagnose MRSA infection nor to guide or monitor treatment for MRSA infections. RESULT CALLED TO, READ BACK BY AND VERIFIED WITH: STURDIVANT D AT 0226 ON [id number] BY Arlana Hove, MD [facility] [phone] pager   [phone] cell 01/10/2016, 10:05  AM

## 2016-01-10 NOTE — Progress Notes (Signed)
Patient requested to leave bed high.  Explained to patient that per policy we are to put the bed in the lowest position to prevent falls and injuries.  Patient refused for bed to be lowered, and keeps heightening bed on his own.

## 2016-01-10 NOTE — Procedures (Signed)
   HEMODIALYSIS TREATMENT NOTE:  4 hour low-heparin dialysis completed via right upper arm AVF (15g/antegrade). Goal met: 2.6 liters removed without interruption in ultrafiltration. Vancomycin and Cefazolin given at end of HD. All blood was returned and hemostasis was achieved within 15 minutes. Report given to Deeann Dowse, RN.  Rockwell Alexandria, RN, CDN

## 2016-01-10 NOTE — Progress Notes (Signed)
Pharmacy Note:  ANTIBIOTICS  Pharmacy consulted for antibiotics Vancomycin and ANCEF pending final blood cultures. Pt being treated for bacteremia.  Pt has ESRD requiring dialysis and current dialysis schedule is T-Th-Sa.  MRSA PCR (+)  Plan:  Vancomycin  IV after each HD session  Ancef 2gm IV after each HD session  Narrow ABX when final c/s reported  Appreciate ID input / recommendations  No Known Allergies  Filed Vitals:   01/09/16 2308 01/10/16 0546  BP: 80/49 105/65  Pulse: 85 87  Temp: 98.3 F (36.8 C) 98.1 F (36.7 C)  Resp: 18 18    Anti-infectives    Start     Dose/Rate Route Frequency Ordered Stop   01/10/16 1600  ceFAZolin (ANCEF) IVPB 2 g/50 mL premix     2 g 100 mL/hr over 30 Minutes Intravenous Every T-Th-Sa (Hemodialysis) 01/10/16 1031     01/10/16 1200  vancomycin (VANCOCIN) IVPB 750 mg/150 ml premix     750 mg 150 mL/hr over 60 Minutes Intravenous Every T-Th-Sa (Hemodialysis) 01/09/16 1541     01/09/16 1500  vancomycin (VANCOCIN) IVPB 750 mg/150 ml premix  Status:  Discontinued     750 mg 150 mL/hr over 60 Minutes Intravenous Every Dialysis 01/09/16 1201 01/09/16 1541   01/08/16 1730  vancomycin (VANCOCIN) 2,000 mg in sodium chloride 0.9 % 500 mL IVPB     2,000 mg 250 mL/hr over 120 Minutes Intravenous  Once 01/08/16 1711 01/08/16 2125   01/08/16 1730  piperacillin-tazobactam (ZOSYN) 2.25 g in dextrose 5 % 50 mL IVPB  Status:  Discontinued     2.25 g 100 mL/hr over 30 Minutes Intravenous 3 times per day 01/08/16 1719 01/09/16 1254   01/08/16 1715  piperacillin-tazobactam (ZOSYN) IVPB 3.375 g  Status:  Discontinued     3.375 g 100 mL/hr over 30 Minutes Intravenous  Once 01/08/16 1706 01/08/16 1719   01/08/16 1715  vancomycin (VANCOCIN) IVPB 1000 mg/200 mL premix  Status:  Discontinued     1,000 mg 200 mL/hr over 60 Minutes Intravenous  Once 01/08/16 1706 01/08/16 1711     Results for orders placed or performed during the hospital encounter of  01/08/16  Blood Culture (routine x 2)     Status: None (Preliminary result)   Collection Time: 01/08/16  5:15 PM  Result Value Ref Range Status   Specimen Description BLOOD LEFT ANTECUBITAL DRAWN BY RN  Final   Special Requests BOTTLES DRAWN AEROBIC AND ANAEROBIC 6CC EACH  Final   Culture  Setup Time   Final    GRAM POSITIVE COCCI IN CLUSTERS IN BOTH AEROBIC AND ANAEROBIC BOTTLES CRITICAL RESULT CALLED TO, READ BACK BY AND VERIFIED WITH: BULLIN,M AT 1009 ON 01/09/2016 BY WOODS,M    Culture   Final    STAPHYLOCOCCUS AUREUS Performed at [facility]    Report Status PENDING  Incomplete  Blood Culture (routine x 2)     Status: None (Preliminary result)   Collection Time: 01/08/16  5:16 PM  Result Value Ref Range Status   Specimen Description BLOOD RIGHT HAND  Final   Special Requests BOTTLES DRAWN AEROBIC ONLY 8CC  Final   Culture  Setup Time   Final    GRAM POSITIVE COCCI IN CLUSTERS AEROBIC BOTTLE ONLY CRITICAL RESULT CALLED TO, READ BACK BY AND VERIFIED WITH: BULILNS, M. AT 1233 ON 01/09/2016 BY AGUNDIZ, E.    Culture   Final    STAPHYLOCOCCUS AUREUS Performed at [facility]    Report  Status PENDING  Incomplete  Urine culture     Status: None (Preliminary result)   Collection Time: 01/08/16  5:19 PM  Result Value Ref Range Status   Specimen Description URINE, CLEAN CATCH  Final   Special Requests NONE  Final   Culture   Final    NO GROWTH < 24 HOURS Performed at [facility]    Report Status PENDING  Incomplete  MRSA PCR Screening     Status: Abnormal   Collection Time: 01/09/16 12:29 AM  Result Value Ref Range Status   MRSA by PCR POSITIVE (A) NEGATIVE Final    Comment:        The GeneXpert MRSA Assay (FDA approved for NASAL specimens only), is one component of a comprehensive MRSA colonization surveillance program. It is not intended to diagnose MRSA infection nor to guide or monitor treatment for MRSA infections. RESULT CALLED TO, READ  BACK BY AND VERIFIED WITH: STURDIVANT D AT 0226 ON [id number] BY FORSYTH K    Thank you for allowing pharmacy services to participate.  Wayland Denis, [facility] 01/10/2016 10:32 AM

## 2016-01-10 NOTE — Progress Notes (Signed)
[facility] PROGRESS NOTE  Victor White ZOX:[id number] DOB: 1966/10/26 DOA: 01/08/2016 PCP: No primary care provider on file.  Assessment/Plan: Sepsis/gram-positive cocci bacteremia -Continue vancomycin, Ancef added per infectious diseases. Can attempt to narrow antibiotics once sensitivities are available. -Unfortunately has an AICD and will need a TEE to make sure this has not been seeded. -2-D echo has been requested.  -Source of gram-positive bacteremia remains unclear at present. He complained yesterday of right elbow pain, but clearly does not appear to be inflamed or infected, it appears upon arrival he also complained of left foot pain again the foot does not appear to have any source of infection.  End-stage renal disease -on hemodialysis Tuesday Thursday Saturday. -Discussed with nephrology need for dialysis as an inpatient.  Diabetes mellitus -Fair control, continue current regimen and adjust as needed.  Code Status: Full code Family Communication: Patient only  Disposition Plan: Back to SNF once ready   Consultants:  None   Antibiotics:  Vancomycin   Subjective:  patient is anxious to go outside. I suspect he wants to go and smoke, I have offered to provide him with a nicotine patch however he refuses. States he feels well, specifically he tells me that the headaches and chills that he was experiencing are now gone.   Objective: Filed Vitals:   01/10/16 1505 01/10/16 1530 01/10/16 1545 01/10/16 1600  BP: 111/72 100/62 97/64 109/65  Pulse: 85 81 81 82  Temp:      TempSrc:      Resp:      Height:      Weight:      SpO2:        Intake/Output Summary (Last 24 hours) at 01/10/16 1630 Last data filed at 01/09/16 1900  Gross per 24 hour  Intake    240 ml  Output    200 ml  Net     40 ml   Filed Weights   01/08/16 2301 01/09/16 0606 01/10/16 1455  Weight: 78.8 kg (173 lb 11.6 oz) 79.1 kg (174 lb 6.1 oz) 81.6 kg (179 lb 14.3 oz)     Exam:   General:  Alert, awake, oriented 3  Cardiovascular: Regular rate and rhythm  Respiratory: Clear to auscultation bilaterally  Abdomen: Soft, nontender, nondistended, positive bowel sounds  Extremities: No clubbing, cyanosis or edema   Neurologic:  Grossly intact and nonfocal  Data Reviewed: Basic Metabolic Panel:  Recent Labs Lab 01/08/16 1715 01/09/16 0233 01/10/16 0547  NA 135 134* 135  K 4.7 3.5 4.0  CL 89* 95* 93*  CO2 32 26 27  GLUCOSE 192* 157* 106*  BUN 32* 41* 57*  CREATININE 5.00* 5.66* 7.03*  CALCIUM 10.1 9.0 9.4   Liver Function Tests:  Recent Labs Lab 01/08/16 1715  AST 28  ALT 17  ALKPHOS 137*  BILITOT 0.9  PROT 9.2*  ALBUMIN 3.5   No results for input(s): LIPASE, AMYLASE in the last 168 hours. No results for input(s): AMMONIA in the last 168 hours. CBC:  Recent Labs Lab 01/08/16 1715 01/09/16 0233 01/10/16 0547  WBC 11.2* 14.6* 10.6*  NEUTROABS 9.6* 12.3*  --   HGB 15.0 12.8* 12.9*  HCT 43.4 37.6* 37.7*  MCV 94.6 95.4 94.3  PLT 141* 115* 125*   Cardiac Enzymes: No results for input(s): CKTOTAL, CKMB, CKMBINDEX, TROPONINI in the last 168 hours. BNP (last 3 results) No results for input(s): BNP in the last 8760 hours.  ProBNP (last 3 results) No results for input(s):  PROBNP in the last 8760 hours.  CBG:  Recent Labs Lab 01/09/16 1641 01/09/16 1719 01/09/16 2304 01/10/16 0813 01/10/16 1144  GLUCAP 128* 129* 153* 101* 101*    Recent Results (from the past 240 hour(s))  Blood Culture (routine x 2)     Status: None (Preliminary result)   Collection Time: 01/08/16  5:15 PM  Result Value Ref Range Status   Specimen Description BLOOD LEFT ANTECUBITAL DRAWN BY RN  Final   Special Requests BOTTLES DRAWN AEROBIC AND ANAEROBIC 6CC EACH  Final   Culture  Setup Time   Final    GRAM POSITIVE COCCI IN CLUSTERS IN BOTH AEROBIC AND ANAEROBIC BOTTLES CRITICAL RESULT CALLED TO, READ BACK BY AND VERIFIED WITH: BULLIN,M AT  1009 ON 01/09/2016 BY WOODS,M    Culture   Final    STAPHYLOCOCCUS AUREUS Performed at [facility]    Report Status PENDING  Incomplete  Blood Culture (routine x 2)     Status: None (Preliminary result)   Collection Time: 01/08/16  5:16 PM  Result Value Ref Range Status   Specimen Description BLOOD RIGHT HAND  Final   Special Requests BOTTLES DRAWN AEROBIC ONLY 8CC  Final   Culture  Setup Time   Final    GRAM POSITIVE COCCI IN CLUSTERS AEROBIC BOTTLE ONLY CRITICAL RESULT CALLED TO, READ BACK BY AND VERIFIED WITH: BULILNS, M. AT 1233 ON 01/09/2016 BY AGUNDIZ, E.    Culture   Final    STAPHYLOCOCCUS AUREUS Performed at [facility]    Report Status PENDING  Incomplete  Urine culture     Status: None   Collection Time: 01/08/16  5:19 PM  Result Value Ref Range Status   Specimen Description URINE, CLEAN CATCH  Final   Special Requests NONE  Final   Culture   Final    MULTIPLE SPECIES PRESENT, SUGGEST RECOLLECTION Performed at [facility]    Report Status 01/10/2016 FINAL  Final  MRSA PCR Screening     Status: Abnormal   Collection Time: 01/09/16 12:29 AM  Result Value Ref Range Status   MRSA by PCR POSITIVE (A) NEGATIVE Final    Comment:        The GeneXpert MRSA Assay (FDA approved for NASAL specimens only), is one component of a comprehensive MRSA colonization surveillance program. It is not intended to diagnose MRSA infection nor to guide or monitor treatment for MRSA infections. RESULT CALLED TO, READ BACK BY AND VERIFIED WITH: STURDIVANT D AT 0226 ON [id number] BY FORSYTH K   Culture, blood (routine x 2)     Status: None (Preliminary result)   Collection Time: 01/10/16 11:18 AM  Result Value Ref Range Status   Specimen Description LEFT ANTECUBITAL  Final   Special Requests BOTTLES DRAWN AEROBIC AND ANAEROBIC 8CC  Final   Culture PENDING  Incomplete   Report Status PENDING  Incomplete     Studies: Dg Chest 2 View  01/08/2016  CLINICAL  DATA:  Dialysis patient. Patient had dialysis today with flu like symptoms with cough and fever. EXAM: CHEST  2 VIEW COMPARISON:  11/27/2005 FINDINGS: Cardiac silhouette is mildly enlarged. No mediastinal or hilar masses or evidence of adenopathy. Left anterior chest wall cardioverter-defibrillator is stable. Clear lungs.  No pleural effusion or pneumothorax. Bony thorax is intact. IMPRESSION: No acute cardiopulmonary disease. Electronically Signed   By: Amie Portland M.D.   On: 01/08/2016 19:15   Dg Foot Complete Left  01/08/2016  CLINICAL DATA:  Patient  has cough and fever and pain. EXAM: LEFT FOOT - COMPLETE 3+ VIEW COMPARISON:  None. 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 There is diffuse osteopenia. Plantar calcaneal spur is identified. IMPRESSION: Chronic changes of the left foot. 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 Electronically Signed   By: Sherian Rein M.D.   On: 01/08/2016 19:15    Scheduled Meds: . acetaminophen  1,000 mg Oral Once  . atorvastatin  10 mg Oral Daily  . carvedilol  6.25 mg Oral BID WC  .  ceFAZolin (ANCEF) IV  2 g Intravenous Q T,Th,Sa-HD  . famotidine  20 mg Oral Daily  . gabapentin  100 mg Oral Daily  . heparin      . heparin  5,000 Units Subcutaneous 3 times per day  . hydrALAZINE  25 mg Oral TID  . insulin aspart  0-9 Units Subcutaneous TID WC  . isosorbide dinitrate  10 mg Oral BID  . pantoprazole  40 mg Oral Daily  . prasugrel  10 mg Oral Daily  . sevelamer carbonate  2,400 mg Oral TID  . sodium chloride flush  3 mL Intravenous Q12H  . sodium chloride flush  3 mL Intravenous Q12H  . traZODone  50 mg Oral QHS  . vancomycin  750 mg Intravenous Q T,Th,Sa-HD   Continuous Infusions:   Principal Problem:   Staphylococcus aureus bacteremia Active Problems:   ESRD (end stage renal disease) on dialysis (HCC)   Thrombocytopenia (HCC)   Insulin dependent diabetes mellitus (HCC)   Hypertension    Sepsis (HCC)   Normocytic anemia   AICD (automatic cardioverter/defibrillator) present   Cigarette smoker    Time spent: 25 minutes. Greater than 50% of this time was spent in direct contact with the patient coordinating care.    Chaya Jan  [facility] Pager [phone]  If 7PM-7AM, please contact night-coverage at [email or web address], password [facility] 01/10/2016, 4:30 PM  LOS: 2 days

## 2016-01-10 NOTE — Consult Note (Signed)
Reason for Consult: End-stage renal disease Referring Physician: Dr. Olevia Bowens Victor White is an 49 y.o. male.  HPI: He is a patient with history of hypertension, diabetes, end-stage renal disease on maintenance hemodialysis presently came from dialysis with history of chills, fever and sweating. When patient was evaluated and was found to have fever hence admitted to the hospital for possible sepsis. Patient was found to have gram-positive bacteremia. Source of infection at this moment is not clear. Patient has a fistula and presently seems to be without any sign of inflammation. Presently consult is called for dialysis. Patient stated that he is feeling much better. He denies any more fever chills or sweating.  Past Medical History  Diagnosis Date  . Diabetes mellitus without complication ([place])   . Hypertension   . Renal disorder   . AICD (automatic cardioverter/defibrillator) present     Past Surgical History  Procedure Laterality Date  . Insert / replace / remove pacemaker      Family History  Problem Relation Age of Onset  . Diabetes type II Other     Social History:  reports that he has been smoking Cigarettes.  He does not have any smokeless tobacco history on file. He reports that he does not drink alcohol or use illicit drugs.  Allergies: No Known Allergies  Medications: I have reviewed the patient's current medications.  Results for orders placed or performed during the hospital encounter of 01/08/16 (from the past 48 hour(s))  Comprehensive metabolic panel     Status: Abnormal   Collection Time: 01/08/16  5:15 PM  Result Value Ref Range   Sodium 135 135 - 145 mmol/L   Potassium 4.7 3.5 - 5.1 mmol/L   Chloride 89 (L) 101 - 111 mmol/L   CO2 32 22 - 32 mmol/L   Glucose, Bld 192 (H) 65 - 99 mg/dL   BUN 32 (H) 6 - 20 mg/dL   Creatinine, Ser 5.00 (H) 0.61 - 1.24 mg/dL   Calcium 10.1 8.9 - 10.3 mg/dL   Total Protein 9.2 (H) 6.5 - 8.1 g/dL   Albumin 3.5 3.5 - 5.0  g/dL   AST 28 15 - 41 U/L   ALT 17 17 - 63 U/L   Alkaline Phosphatase 137 (H) 38 - 126 U/L   Total Bilirubin 0.9 0.3 - 1.2 mg/dL   GFR calc non Af Amer 12 (L) >60 mL/min   GFR calc Af Amer 14 (L) >60 mL/min    Comment: (NOTE) The eGFR has been calculated using the CKD EPI equation. This calculation has not been validated in all clinical situations. eGFR's persistently <60 mL/min signify possible Chronic Kidney Disease.    Anion gap 14 5 - 15  CBC WITH DIFFERENTIAL     Status: Abnormal   Collection Time: 01/08/16  5:15 PM  Result Value Ref Range   WBC 11.2 (H) 4.0 - 10.5 K/uL   RBC 4.59 4.22 - 5.81 MIL/uL   Hemoglobin 15.0 13.0 - 17.0 g/dL   HCT 43.4 39.0 - 52.0 %   MCV 94.6 78.0 - 100.0 fL   MCH 32.7 26.0 - 34.0 pg   MCHC 34.6 30.0 - 36.0 g/dL   RDW 17.3 (H) 11.5 - 15.5 %   Platelets 141 (L) 150 - 400 K/uL    Comment: SPECIMEN CHECKED FOR CLOTS LARGE PLATELETS PRESENT PLATELET COUNT CONFIRMED BY SMEAR    Neutrophils Relative % 85 %   Neutro Abs 9.6 (H) 1.7 - 7.7 K/uL   Lymphocytes  Relative 8 %   Lymphs Abs 0.9 0.7 - 4.0 K/uL   Monocytes Relative 6 %   Monocytes Absolute 0.7 0.1 - 1.0 K/uL   Eosinophils Relative 0 %   Eosinophils Absolute 0.0 0.0 - 0.7 K/uL   Basophils Relative 0 %   Basophils Absolute 0.0 0.0 - 0.1 K/uL  Blood Culture (routine x 2)     Status: None (Preliminary result)   Collection Time: 01/08/16  5:15 PM  Result Value Ref Range   Specimen Description BLOOD LEFT ANTECUBITAL DRAWN BY RN    Special Requests BOTTLES DRAWN AEROBIC AND ANAEROBIC 6CC EACH    Culture  Setup Time      GRAM POSITIVE COCCI IN CLUSTERS Gram Stain Report Called to,Read Back By and Verified With: BULLIN,M AT 3790 ON 01/09/2016 BY WOODS,M ON BOTH AEROBIC AND ANAROBIC    Culture NO GROWTH < 12 HOURS    Report Status PENDING   Procalcitonin     Status: None   Collection Time: 01/08/16  5:15 PM  Result Value Ref Range   Procalcitonin 2.44 ng/mL    Comment:         Interpretation: PCT > 2 ng/mL: Systemic infection (sepsis) is likely, unless other causes are known. (NOTE)         ICU PCT Algorithm               Non ICU PCT Algorithm    ----------------------------     ------------------------------         PCT < 0.25 ng/mL                 PCT < 0.1 ng/mL     Stopping of antibiotics            Stopping of antibiotics       strongly encouraged.               strongly encouraged.    ----------------------------     ------------------------------       PCT level decrease by               PCT < 0.25 ng/mL       >= 80% from peak PCT       OR PCT 0.25 - 0.5 ng/mL          Stopping of antibiotics                                             encouraged.     Stopping of antibiotics           encouraged.    ----------------------------     ------------------------------       PCT level decrease by              PCT >= 0.25 ng/mL       < 80% from peak PCT        AND PCT >= 0.5 ng/mL            Continuing antibiotics                                               encouraged.       Continuing antibiotics  encouraged.    ----------------------------     ------------------------------     PCT level increase compared          PCT > 0.5 ng/mL         with peak PCT AND          PCT >= 0.5 ng/mL             Escalation of antibiotics                                          strongly encouraged.      Escalation of antibiotics        strongly encouraged.   Protime-INR     Status: None   Collection Time: 01/08/16  5:15 PM  Result Value Ref Range   Prothrombin Time 15.0 11.6 - 15.2 seconds   INR 1.16 0.00 - 1.49  APTT     Status: None   Collection Time: 01/08/16  5:15 PM  Result Value Ref Range   aPTT 35 24 - 37 seconds  Hemoglobin A1c     Status: Abnormal   Collection Time: 01/08/16  5:15 PM  Result Value Ref Range   Hgb A1c MFr Bld 6.7 (H) 4.8 - 5.6 %    Comment: (NOTE)         Pre-diabetes: 5.7 - 6.4         Diabetes: >6.4         Glycemic  control for adults with diabetes: <7.0    Mean Plasma Glucose 146 mg/dL    Comment: (NOTE) Performed At: [facility] [address] [place], [place] [id number] Lindon Romp MD [id number]   Blood Culture (routine x 2)     Status: None (Preliminary result)   Collection Time: 01/08/16  5:16 PM  Result Value Ref Range   Specimen Description BLOOD RIGHT HAND    Special Requests BOTTLES DRAWN AEROBIC ONLY 8CC    Culture  Setup Time      GRAM POSITIVE COCCI IN CLUSTERS Gram Stain Report Called to,Read Back By and Verified With: BULILNS, M. AT 1233 ON 01/09/2016 BY AGUNDIZ, E.    Culture PENDING    Report Status PENDING   Urinalysis, Routine w reflex microscopic (not at [facility])     Status: Abnormal   Collection Time: 01/08/16  5:19 PM  Result Value Ref Range   Color, Urine YELLOW YELLOW   APPearance CLEAR CLEAR   Specific Gravity, Urine 1.010 1.005 - 1.030   pH 6.0 5.0 - 8.0   Glucose, UA 100 (A) NEGATIVE mg/dL   Hgb urine dipstick TRACE (A) NEGATIVE   Bilirubin Urine NEGATIVE NEGATIVE   Ketones, ur NEGATIVE NEGATIVE mg/dL   Protein, ur 100 (A) NEGATIVE mg/dL   Nitrite NEGATIVE NEGATIVE   Leukocytes, UA NEGATIVE NEGATIVE  Urine culture     Status: None (Preliminary result)   Collection Time: 01/08/16  5:19 PM  Result Value Ref Range   Specimen Description URINE, CLEAN CATCH    Special Requests NONE    Culture      NO GROWTH < 24 HOURS Performed at [facility]    Report Status PENDING   Urine microscopic-add on     Status: Abnormal   Collection Time: 01/08/16  5:19 PM  Result Value Ref Range   Squamous Epithelial / LPF 0-5 (A) NONE SEEN   WBC, UA  0-5 0 - 5 WBC/hpf   RBC / HPF 0-5 0 - 5 RBC/hpf   Bacteria, UA FEW (A) NONE SEEN   Casts GRANULAR CAST (A) NEGATIVE  I-Stat CG4 Lactic Acid, ED  (not at  [facility])     Status: Abnormal   Collection Time: 01/08/16  5:24 PM  Result Value Ref Range   Lactic Acid, Venous 3.36 (HH) 0.5 - 2.0 mmol/L   Comment  NOTIFIED PHYSICIAN   I-Stat CG4 Lactic Acid, ED     Status: Abnormal   Collection Time: 01/08/16  7:27 PM  Result Value Ref Range   Lactic Acid, Venous 2.05 (HH) 0.5 - 2.0 mmol/L   Comment NOTIFIED PHYSICIAN   CBG monitoring, ED     Status: Abnormal   Collection Time: 01/08/16  9:53 PM  Result Value Ref Range   Glucose-Capillary 159 (H) 65 - 99 mg/dL  Glucose, capillary     Status: Abnormal   Collection Time: 01/08/16 11:11 PM  Result Value Ref Range   Glucose-Capillary 134 (H) 65 - 99 mg/dL   Comment 1 Notify RN    Comment 2 Document in Chart   Lactic acid, plasma     Status: None   Collection Time: 01/08/16 11:19 PM  Result Value Ref Range   Lactic Acid, Venous 1.5 0.5 - 2.0 mmol/L  MRSA PCR Screening     Status: Abnormal   Collection Time: 01/09/16 12:29 AM  Result Value Ref Range   MRSA by PCR POSITIVE (A) NEGATIVE    Comment:        The GeneXpert MRSA Assay (FDA approved for NASAL specimens only), is one component of a comprehensive MRSA colonization surveillance program. It is not intended to diagnose MRSA infection nor to guide or monitor treatment for MRSA infections. RESULT CALLED TO, READ BACK BY AND VERIFIED WITH: STURDIVANT D AT 0226 ON [id number] BY FORSYTH K   Lactic acid, plasma     Status: None   Collection Time: 01/09/16  2:33 AM  Result Value Ref Range   Lactic Acid, Venous 1.8 0.5 - 2.0 mmol/L  Basic metabolic panel     Status: Abnormal   Collection Time: 01/09/16  2:33 AM  Result Value Ref Range   Sodium 134 (L) 135 - 145 mmol/L   Potassium 3.5 3.5 - 5.1 mmol/L    Comment: DELTA CHECK NOTED   Chloride 95 (L) 101 - 111 mmol/L   CO2 26 22 - 32 mmol/L   Glucose, Bld 157 (H) 65 - 99 mg/dL   BUN 41 (H) 6 - 20 mg/dL   Creatinine, Ser 5.66 (H) 0.61 - 1.24 mg/dL   Calcium 9.0 8.9 - 10.3 mg/dL   GFR calc non Af Amer 11 (L) >60 mL/min   GFR calc Af Amer 12 (L) >60 mL/min    Comment: (NOTE) The eGFR has been calculated using the CKD EPI equation. This  calculation has not been validated in all clinical situations. eGFR's persistently <60 mL/min signify possible Chronic Kidney Disease.    Anion gap 13 5 - 15  CBC WITH DIFFERENTIAL     Status: Abnormal   Collection Time: 01/09/16  2:33 AM  Result Value Ref Range   WBC 14.6 (H) 4.0 - 10.5 K/uL   RBC 3.94 (L) 4.22 - 5.81 MIL/uL   Hemoglobin 12.8 (L) 13.0 - 17.0 g/dL   HCT 37.6 (L) 39.0 - 52.0 %   MCV 95.4 78.0 - 100.0 fL   MCH 32.5 26.0 - 34.0 pg  MCHC 34.0 30.0 - 36.0 g/dL   RDW 17.5 (H) 11.5 - 15.5 %   Platelets 115 (L) 150 - 400 K/uL    Comment: REPEATED TO VERIFY SPECIMEN CHECKED FOR CLOTS    Neutrophils Relative % 84 %   Neutro Abs 12.3 (H) 1.7 - 7.7 K/uL   Lymphocytes Relative 9 %   Lymphs Abs 1.3 0.7 - 4.0 K/uL   Monocytes Relative 7 %   Monocytes Absolute 1.0 0.1 - 1.0 K/uL   Eosinophils Relative 0 %   Eosinophils Absolute 0.0 0.0 - 0.7 K/uL   Basophils Relative 0 %   Basophils Absolute 0.0 0.0 - 0.1 K/uL   Smear Review PLATELETS APPEAR ADEQUATE   Glucose, capillary     Status: Abnormal   Collection Time: 01/09/16  8:09 AM  Result Value Ref Range   Glucose-Capillary 126 (H) 65 - 99 mg/dL   Comment 1 Notify RN    Comment 2 Document in Chart   Influenza panel by PCR (type A & B, H1N1)     Status: None   Collection Time: 01/09/16  8:52 AM  Result Value Ref Range   Influenza A By PCR NEGATIVE NEGATIVE   Influenza B By PCR NEGATIVE NEGATIVE   H1N1 flu by pcr NOT DETECTED NOT DETECTED    Comment:        The Xpert Flu assay (FDA approved for nasal aspirates or washes and nasopharyngeal swab specimens), is intended as an aid in the diagnosis of influenza and should not be used as a sole basis for treatment.   Glucose, capillary     Status: Abnormal   Collection Time: 01/09/16 11:35 AM  Result Value Ref Range   Glucose-Capillary 179 (H) 65 - 99 mg/dL   Comment 1 Notify RN    Comment 2 Document in Chart   Glucose, capillary     Status: Abnormal   Collection  Time: 01/09/16  4:41 PM  Result Value Ref Range   Glucose-Capillary 128 (H) 65 - 99 mg/dL  Glucose, capillary     Status: Abnormal   Collection Time: 01/09/16  5:19 PM  Result Value Ref Range   Glucose-Capillary 129 (H) 65 - 99 mg/dL   Comment 1 Notify RN    Comment 2 Document in Chart   Glucose, capillary     Status: Abnormal   Collection Time: 01/09/16 11:04 PM  Result Value Ref Range   Glucose-Capillary 153 (H) 65 - 99 mg/dL  Basic metabolic panel     Status: Abnormal   Collection Time: 01/10/16  5:47 AM  Result Value Ref Range   Sodium 135 135 - 145 mmol/L   Potassium 4.0 3.5 - 5.1 mmol/L   Chloride 93 (L) 101 - 111 mmol/L   CO2 27 22 - 32 mmol/L   Glucose, Bld 106 (H) 65 - 99 mg/dL   BUN 57 (H) 6 - 20 mg/dL   Creatinine, Ser 7.03 (H) 0.61 - 1.24 mg/dL   Calcium 9.4 8.9 - 10.3 mg/dL   GFR calc non Af Amer 8 (L) >60 mL/min   GFR calc Af Amer 9 (L) >60 mL/min    Comment: (NOTE) The eGFR has been calculated using the CKD EPI equation. This calculation has not been validated in all clinical situations. eGFR's persistently <60 mL/min signify possible Chronic Kidney Disease.    Anion gap 15 5 - 15  CBC     Status: Abnormal   Collection Time: 01/10/16  5:47 AM  Result Value Ref  Range   WBC 10.6 (H) 4.0 - 10.5 K/uL   RBC 4.00 (L) 4.22 - 5.81 MIL/uL   Hemoglobin 12.9 (L) 13.0 - 17.0 g/dL   HCT 37.7 (L) 39.0 - 52.0 %   MCV 94.3 78.0 - 100.0 fL   MCH 32.3 26.0 - 34.0 pg   MCHC 34.2 30.0 - 36.0 g/dL   RDW 17.3 (H) 11.5 - 15.5 %   Platelets 125 (L) 150 - 400 K/uL  Glucose, capillary     Status: Abnormal   Collection Time: 01/10/16  8:13 AM  Result Value Ref Range   Glucose-Capillary 101 (H) 65 - 99 mg/dL    Dg Chest 2 View  01/08/2016  CLINICAL DATA:  Dialysis patient. Patient had dialysis today with flu like symptoms with cough and fever. EXAM: CHEST  2 VIEW COMPARISON:  11/27/2005 FINDINGS: Cardiac silhouette is mildly enlarged. No mediastinal or hilar masses or evidence  of adenopathy. Left anterior chest wall cardioverter-defibrillator is stable. Clear lungs.  No pleural effusion or pneumothorax. Bony thorax is intact. IMPRESSION: No acute cardiopulmonary disease. Electronically Signed   By: Lajean Manes M.D.   On: 01/08/2016 19:15   Dg Foot Complete Left  01/08/2016  CLINICAL DATA:  Patient has cough and fever and pain. EXAM: LEFT FOOT - COMPLETE 3+ VIEW COMPARISON:  None. 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 There is diffuse osteopenia. Plantar calcaneal spur is identified. IMPRESSION: Chronic changes of the left foot. 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 Electronically Signed   By: Abelardo Diesel M.D.   On: 01/08/2016 19:15    Review of Systems  Constitutional: Positive for fever and chills.  Respiratory: Negative for cough, sputum production and shortness of breath.   Cardiovascular: Negative for orthopnea and leg swelling.  Gastrointestinal: Negative for nausea and vomiting.  Genitourinary: Negative for dysuria and urgency.   Blood pressure 105/65, pulse 87, temperature 98.1 F (36.7 C), temperature source Oral, resp. rate 18, height 6' (1.829 m), weight 174 lb 6.1 oz (79.1 kg), SpO2 97 %. Physical Exam  Constitutional: He is oriented to person, place, and time. No distress.  Eyes: No scleral icterus.  Neck: No JVD present.  Cardiovascular: Normal rate and regular rhythm.   No murmur heard. Respiratory: No respiratory distress. He has no wheezes.  GI: He exhibits no distension. There is no tenderness.  Musculoskeletal: He exhibits no edema.  Neurological: He is alert and oriented to person, place, and time.    Assessment/Plan: Problem #1 gram-positive sepsis. Presently patient is on antibiotics and is afebrile. His white blood cell count has improved. Source of infection at this moment is not clear. Problem #2 end-stage renal disease: He is status post hemodialysis on Thursday.  Presently patient doesn't have any uremic signs and symptoms. His potassium is normal. Problem #3 hypertension: His blood pressure is reasonably controlled Problem #4 diabetes: His trunk blood sugars 106 and patient is asymptomatic Problem #5 anemia: His hemoglobin is above our target goal. Problem #6 metabolic bone disease: His calcium is range but phosphorus is not available. Problem #7 peripheral vascular disease Plan: 1] We'll make arrangements for patient to get dialysis today 2] we'll dialyze him for 4 hours, using 2K/2.5 calcium bath. 3] we'll remove 3 L if his blood pressure tolerates. 4] we'll check his basic metabolic panel, phosphorus in the morning 5] we'll hold Epogen.  Zakar Brosch S 01/10/2016, 9:04 AM

## 2016-01-11 ENCOUNTER — Inpatient Hospital Stay (HOSPITAL_COMMUNITY): Payer: Medicare Other

## 2016-01-11 LAB — CULTURE, BLOOD (ROUTINE X 2)

## 2016-01-11 LAB — GLUCOSE, CAPILLARY
GLUCOSE-CAPILLARY: 145 mg/dL — AB (ref 65–99)
Glucose-Capillary: 137 mg/dL — ABNORMAL HIGH (ref 65–99)
Glucose-Capillary: 160 mg/dL — ABNORMAL HIGH (ref 65–99)

## 2016-01-11 MED ORDER — SODIUM CHLORIDE 0.9 % IV BOLUS (SEPSIS)
500.0000 mL | Freq: Once | INTRAVENOUS | Status: AC
  Administered 2016-01-11: 500 mL via INTRAVENOUS

## 2016-01-11 MED ORDER — PERFLUTREN LIPID MICROSPHERE
1.0000 mL | INTRAVENOUS | Status: AC | PRN
  Administered 2016-01-11: 2 mL via INTRAVENOUS
  Filled 2016-01-11: qty 10

## 2016-01-11 NOTE — Progress Notes (Signed)
Patient taken outside for brief time with permission from ED and [facility] house supervisor. MD orders ok for patient to be off tele for brief time to allow this. I, the RN, accompanied the patient the entire time.  Patient very anxious and wanted "fresh air".  Abided by hospital policy and did not attempt to smoke.  No complications while off unit, returned to room safely and helped back to bed.

## 2016-01-11 NOTE — Progress Notes (Signed)
Subjective:  No complaints, fever resolved, no dyspnea, good appetite without nausea or vomiting  Objective: Vital signs in last 24 hours: Temp:  [97.6 F (36.4 C)-98.7 F (37.1 C)] 98.7 F (37.1 C) (02/26 0536) Pulse Rate:  [79-95] 79 (02/26 0536) Resp:  [16] 16 (02/26 0536) BP: (83-113)/(44-74) 83/44 mmHg (02/26 0536) SpO2:  [95 %-98 %] 95 % (02/26 0536) Weight:  [80.9 kg (178 lb 5.6 oz)-81.6 kg (179 lb 14.3 oz)] 80.9 kg (178 lb 5.6 oz) (02/26 0540) Weight change:   Intake/Output from previous day: 02/25 0701 - 02/26 0700 In: 480 [P.O.:480] Out: 2807 [Urine:200] Intake/Output this shift:   EXAM: General appearance:  Alert, in no apparent distress Resp:  CTA without rales, rhonchi, or wheezes Cardio:  RRR without murmur GI:  + BS, soft and nontender Extremities:  No edema Access:  AVF @ RUA with + bruit  Lab Results:  Recent Labs  01/09/16 0233 01/10/16 0547  WBC 14.6* 10.6*  HGB 12.8* 12.9*  HCT 37.6* 37.7*  PLT 115* 125*   BMET:  Recent Labs  01/08/16 1715 01/09/16 0233 01/10/16 0547  NA 135 134* 135  K 4.7 3.5 4.0  CL 89* 95* 93*  CO2 32 26 27  GLUCOSE 192* 157* 106*  BUN 32* 41* 57*  CREATININE 5.00* 5.66* 7.03*  CALCIUM 10.1 9.0 9.4  ALBUMIN 3.5  --   --    No results for input(s): PTH in the last 72 hours. Iron Studies: No results for input(s): IRON, TIBC, TRANSFERRIN, FERRITIN in the last 72 hours.  Studies/Results: No results found.   Assessment/Plan: 1. Sepsis - Blood cultures 2/23 with Gr + cocci, on Vancomycin & Cefazolin per Infectious Disease; unknown etiology, AVF without signs of infection, TEE pending to check AICD . 2. End-stage renal disease - on dialysis TTS at [facility] in [place]; stable s/p dialysis yesterday, K 4, no uremic signs and symptoms. 3. HTN - His blood pressure is controlled, but low, on Hydralazine 25 mg tid, asymptomatic. 4. Diabetes - Glucose stable on insulin, asymptomatic. 5. Anemia - His hemoglobin is above  our target goal. 6. Metabolic bone disease - His calcium is within range, but phosphorus is not available. 7. Peripheral vascular disease  Plan:  Next dialysis on 2/28.           Renal panel pre-dialysis.  LOS: 3 days   LYLES,CHARLES 01/11/2016,7:39 AM  Addendum Patient will be seen by cardiology tomorrow for possible TEE  His next dialysis will be on [place]

## 2016-01-11 NOTE — Progress Notes (Signed)
[facility] PROGRESS NOTE  Victor White ZOX:[id number] DOB: Sep 02, 1966 DOA: 01/08/2016 PCP: No primary care provider on file.  Assessment/Plan: Sepsis/MRSA bacteremia -Continue vancomycin, DC Ancef -Repeat BC are negative at 24 hours. -Place PICC once repeat cx are at least 48 hours negative. -Unfortunately has an AICD and will need a TEE to make sure this has not been seeded. -2-D echo has been requested.  -Source of gram-positive bacteremia remains unclear at present. He complained yesterday of right elbow pain, but clearly does not appear to be inflamed or infected, it appears upon arrival he also complained of left foot pain again the foot does not appear to have any source of infection.  End-stage renal disease -on hemodialysis Tuesday Thursday Saturday. -Discussed with nephrology need for dialysis as an inpatient.  Diabetes mellitus -Fair control, continue current regimen and adjust as needed.  Code Status: Full code Family Communication: Patient only  Disposition Plan: Back to SNF once ready   Consultants:  None   Antibiotics:  Vancomycin   Subjective:  patient is anxious to go outside. I suspect he wants to go and smoke, I have offered to provide him with a nicotine patch however he refuses. States he feels well, specifically he tells me that the headaches and chills that he was experiencing are now gone.   Objective: Filed Vitals:   01/10/16 1928 01/10/16 2144 01/11/16 0536 01/11/16 0540  BP: 108/65 96/58 83/44    Pulse: 84 95 79   Temp: 97.8 F (36.6 C) 98 F (36.7 C) 98.7 F (37.1 C)   TempSrc: Oral Oral    Resp: Height:      Weight:    80.9 kg (178 lb 5.6 oz)  SpO2: 98% 98% 95%     Intake/Output Summary (Last 24 hours) at 01/11/16 1142 Last data filed at 01/10/16 1905  Gross per 24 hour  Intake    480 ml  Output   2807 ml  Net  -2327 ml   Filed Weights   01/09/16 0606 01/10/16 1455 01/11/16 0540  Weight: 79.1 kg (174 lb  6.1 oz) 81.6 kg (179 lb 14.3 oz) 80.9 kg (178 lb 5.6 oz)    Exam:   General:  Alert, awake, oriented 3  Cardiovascular: Regular rate and rhythm  Respiratory: Clear to auscultation bilaterally  Abdomen: Soft, nontender, nondistended, positive bowel sounds  Extremities: No clubbing, cyanosis or edema   Neurologic:  Grossly intact and nonfocal  Data Reviewed: Basic Metabolic Panel:  Recent Labs Lab 01/08/16 1715 01/09/16 0233 01/10/16 0547  NA 135 134* 135  K 4.7 3.5 4.0  CL 89* 95* 93*  CO2 32 26 27  GLUCOSE 192* 157* 106*  BUN 32* 41* 57*  CREATININE 5.00* 5.66* 7.03*  CALCIUM 10.1 9.0 9.4   Liver Function Tests:  Recent Labs Lab 01/08/16 1715  AST 28  ALT 17  ALKPHOS 137*  BILITOT 0.9  PROT 9.2*  ALBUMIN 3.5   No results for input(s): LIPASE, AMYLASE in the last 168 hours. No results for input(s): AMMONIA in the last 168 hours. CBC:  Recent Labs Lab 01/08/16 1715 01/09/16 0233 01/10/16 0547  WBC 11.2* 14.6* 10.6*  NEUTROABS 9.6* 12.3*  --   HGB 15.0 12.8* 12.9*  HCT 43.4 37.6* 37.7*  MCV 94.6 95.4 94.3  PLT 141* 115* 125*   Cardiac Enzymes: No results for input(s): CKTOTAL, CKMB, CKMBINDEX, TROPONINI in the last 168 hours. BNP (last 3 results) No results for input(s):  BNP in the last 8760 hours.  ProBNP (last 3 results) No results for input(s): PROBNP in the last 8760 hours.  CBG:  Recent Labs Lab 01/10/16 0813 01/10/16 1144 01/10/16 2152 01/11/16 0803 01/11/16 1135  GLUCAP 101* 101* 181* 145* 137*    Recent Results (from the past 240 hour(s))  Blood Culture (routine x 2)     Status: None   Collection Time: 01/08/16  5:15 PM  Result Value Ref Range Status   Specimen Description BLOOD LEFT ANTECUBITAL DRAWN BY RN  Final   Special Requests BOTTLES DRAWN AEROBIC AND ANAEROBIC 6CC EACH  Final   Culture  Setup Time   Final    GRAM POSITIVE COCCI IN CLUSTERS IN BOTH AEROBIC AND ANAEROBIC BOTTLES CRITICAL RESULT CALLED TO, READ  BACK BY AND VERIFIED WITH: BULLIN,M AT 1009 ON 01/09/2016 BY WOODS,M    Culture   Final    METHICILLIN RESISTANT STAPHYLOCOCCUS AUREUS Performed at [facility]    Report Status 01/11/2016 FINAL  Final   Organism ID, Bacteria METHICILLIN RESISTANT STAPHYLOCOCCUS AUREUS  Final      Susceptibility   Methicillin resistant staphylococcus aureus - MIC*    CIPROFLOXACIN >=8 RESISTANT Resistant     ERYTHROMYCIN >=8 RESISTANT Resistant     GENTAMICIN >=16 RESISTANT Resistant     OXACILLIN >=4 RESISTANT Resistant     TETRACYCLINE <=1 SENSITIVE Sensitive     VANCOMYCIN <=0.5 SENSITIVE Sensitive     TRIMETH/SULFA >=320 RESISTANT Resistant     CLINDAMYCIN <=0.25 SENSITIVE Sensitive     RIFAMPIN <=0.5 SENSITIVE Sensitive     Inducible Clindamycin NEGATIVE Sensitive     * METHICILLIN RESISTANT STAPHYLOCOCCUS AUREUS  Blood Culture (routine x 2)     Status: None   Collection Time: 01/08/16  5:16 PM  Result Value Ref Range Status   Specimen Description BLOOD RIGHT HAND  Final   Special Requests BOTTLES DRAWN AEROBIC ONLY 8CC  Final   Culture  Setup Time   Final    GRAM POSITIVE COCCI IN CLUSTERS AEROBIC BOTTLE ONLY CRITICAL RESULT CALLED TO, READ BACK BY AND VERIFIED WITH: BULILNS, M. AT 1233 ON 01/09/2016 BY AGUNDIZ, E.    Culture   Final    STAPHYLOCOCCUS AUREUS SUSCEPTIBILITIES PERFORMED ON PREVIOUS CULTURE WITHIN THE LAST 5 DAYS. Performed at [facility]    Report Status 01/11/2016 FINAL  Final  Urine culture     Status: None   Collection Time: 01/08/16  5:19 PM  Result Value Ref Range Status   Specimen Description URINE, CLEAN CATCH  Final   Special Requests NONE  Final   Culture   Final    MULTIPLE SPECIES PRESENT, SUGGEST RECOLLECTION Performed at [facility]    Report Status 01/10/2016 FINAL  Final  MRSA PCR Screening     Status: Abnormal   Collection Time: 01/09/16 12:29 AM  Result Value Ref Range Status   MRSA by PCR POSITIVE (A) NEGATIVE Final     Comment:        The GeneXpert MRSA Assay (FDA approved for NASAL specimens only), is one component of a comprehensive MRSA colonization surveillance program. It is not intended to diagnose MRSA infection nor to guide or monitor treatment for MRSA infections. RESULT CALLED TO, READ BACK BY AND VERIFIED WITH: STURDIVANT D AT 0226 ON 161096 BY FORSYTH K   Culture, blood (routine x 2)     Status: None (Preliminary result)   Collection Time: 01/10/16 11:18 AM  Result Value Ref  Range Status   Specimen Description LEFT ANTECUBITAL  Final   Special Requests BOTTLES DRAWN AEROBIC AND ANAEROBIC 8CC  Final   Culture PENDING  Incomplete   Report Status PENDING  Incomplete     Studies: No results found.  Scheduled Meds: . acetaminophen  1,000 mg Oral Once  . atorvastatin  10 mg Oral Daily  . carvedilol  6.25 mg Oral BID WC  . famotidine  20 mg Oral Daily  . gabapentin  100 mg Oral Daily  . heparin  5,000 Units Subcutaneous 3 times per day  . hydrALAZINE  25 mg Oral TID  . insulin aspart  0-9 Units Subcutaneous TID WC  . isosorbide dinitrate  10 mg Oral BID  . pantoprazole  40 mg Oral Daily  . prasugrel  10 mg Oral Daily  . sevelamer carbonate  2,400 mg Oral TID  . sodium chloride flush  3 mL Intravenous Q12H  . sodium chloride flush  3 mL Intravenous Q12H  . traZODone  50 mg Oral QHS  . vancomycin  750 mg Intravenous Q T,Th,Sa-HD   Continuous Infusions:   Principal Problem:   Staphylococcus aureus bacteremia Active Problems:   ESRD (end stage renal disease) on dialysis (HCC)   Thrombocytopenia (HCC)   Insulin dependent diabetes mellitus (HCC)   Hypertension   Sepsis (HCC)   Normocytic anemia   AICD (automatic cardioverter/defibrillator) present   Cigarette smoker    Time spent: 25 minutes. Greater than 50% of this time was spent in direct contact with the patient coordinating care.    Chaya Jan  [facility] Pager [phone]  If 7PM-7AM, please  contact night-coverage at [email or web address], password [facility] 01/11/2016, 11:42 AM  LOS: 3 days

## 2016-01-11 NOTE — Progress Notes (Signed)
Patient ID: Victor White, male   DOB: 01-31-1966, 50 y.o.   MRN: [id number]         [name] for Infectious Disease     Date of Admission:  01/08/2016           Day 3 vancomycin  He has MRSA bacteremia. He will stay on vancomycin alone. Repeat blood cultures are negative at 24 hours and TTE is pending Dr. Hardie Shackleton note indicates that he has no clinical evidence of left foot or right elbow infection.      [name], MD [facility] for Infectious Disease [facility] [phone] pager   [phone] cell 11/18/2015, 1:32 PM

## 2016-01-11 NOTE — Progress Notes (Signed)
BP 83/44. MD paged and orders given for 500cc bolus IV.

## 2016-01-11 NOTE — Progress Notes (Signed)
*  PRELIMINARY RESULTS* Echocardiogram echo has been performed.  Victor White 01/11/2016, 10:11 AM

## 2016-01-12 ENCOUNTER — Encounter (HOSPITAL_COMMUNITY)
Admission: EM | Disposition: A | Discharge status: Skilled Nursing Facility | Payer: Self-pay | Source: Home / Self Care | Attending: Internal Medicine

## 2016-01-12 ENCOUNTER — Inpatient Hospital Stay (HOSPITAL_COMMUNITY): Payer: Medicare Other | Admitting: Anesthesiology

## 2016-01-12 ENCOUNTER — Encounter (HOSPITAL_COMMUNITY): Payer: Self-pay | Admitting: Cardiology

## 2016-01-12 ENCOUNTER — Inpatient Hospital Stay (HOSPITAL_COMMUNITY): Payer: Medicare Other

## 2016-01-12 DIAGNOSIS — Z720 Tobacco use: Secondary | ICD-10-CM

## 2016-01-12 DIAGNOSIS — Z95810 Presence of automatic (implantable) cardiac defibrillator: Secondary | ICD-10-CM

## 2016-01-12 DIAGNOSIS — R7881 Bacteremia: Secondary | ICD-10-CM

## 2016-01-12 DIAGNOSIS — I2510 Atherosclerotic heart disease of native coronary artery without angina pectoris: Secondary | ICD-10-CM

## 2016-01-12 DIAGNOSIS — B9561 Methicillin susceptible Staphylococcus aureus infection as the cause of diseases classified elsewhere: Secondary | ICD-10-CM

## 2016-01-12 DIAGNOSIS — I340 Nonrheumatic mitral (valve) insufficiency: Secondary | ICD-10-CM

## 2016-01-12 DIAGNOSIS — Z992 Dependence on renal dialysis: Secondary | ICD-10-CM

## 2016-01-12 DIAGNOSIS — N186 End stage renal disease: Secondary | ICD-10-CM

## 2016-01-12 DIAGNOSIS — I429 Cardiomyopathy, unspecified: Secondary | ICD-10-CM

## 2016-01-12 HISTORY — PX: TEE WITHOUT CARDIOVERSION: SHX5443

## 2016-01-12 LAB — RENAL FUNCTION PANEL
ANION GAP: 12 (ref 5–15)
Albumin: 3 g/dL — ABNORMAL LOW (ref 3.5–5.0)
BUN: 47 mg/dL — ABNORMAL HIGH (ref 6–20)
CALCIUM: 9.2 mg/dL (ref 8.9–10.3)
CO2: 26 mmol/L (ref 22–32)
CREATININE: 7.64 mg/dL — AB (ref 0.61–1.24)
Chloride: 96 mmol/L — ABNORMAL LOW (ref 101–111)
GFR, EST AFRICAN AMERICAN: 9 mL/min — AB (ref >60)
GFR, EST NON AFRICAN AMERICAN: 7 mL/min — AB (ref >60)
Glucose, Bld: 144 mg/dL — ABNORMAL HIGH (ref 65–99)
Phosphorus: 5.6 mg/dL — ABNORMAL HIGH (ref 2.5–4.6)
Potassium: 4.2 mmol/L (ref 3.5–5.1)
SODIUM: 134 mmol/L — AB (ref 135–145)

## 2016-01-12 LAB — GLUCOSE, CAPILLARY
GLUCOSE-CAPILLARY: 121 mg/dL — AB (ref 65–99)
GLUCOSE-CAPILLARY: 116 mg/dL — AB (ref 65–99)
GLUCOSE-CAPILLARY: 134 mg/dL — AB (ref 65–99)
GLUCOSE-CAPILLARY: 133 mg/dL — AB (ref 65–99)
Glucose-Capillary: 88 mg/dL (ref 65–99)
Glucose-Capillary: 132 mg/dL — ABNORMAL HIGH (ref 65–99)

## 2016-01-12 LAB — CBC
HCT: 38.1 % — ABNORMAL LOW (ref 39.0–52.0)
HEMOGLOBIN: 13.2 g/dL (ref 13.0–17.0)
MCH: 32.6 pg (ref 26.0–34.0)
MCHC: 34.6 g/dL (ref 30.0–36.0)
MCV: 94.1 fL (ref 78.0–100.0)
PLATELETS: 142 10*3/uL — AB (ref 150–400)
RBC: 4.05 MIL/uL — AB (ref 4.22–5.81)
RDW: 16.9 % — ABNORMAL HIGH (ref 11.5–15.5)
WBC: 4.2 10*3/uL (ref 4.0–10.5)

## 2016-01-12 SURGERY — ECHOCARDIOGRAM, TRANSESOPHAGEAL
Anesthesia: Monitor Anesthesia Care

## 2016-01-12 MED ORDER — FENTANYL CITRATE (PF) 100 MCG/2ML IJ SOLN: 25.0000 ug | INTRAMUSCULAR | Status: DC | PRN

## 2016-01-12 MED ORDER — SODIUM CHLORIDE 0.9 % IV SOLN
INTRAVENOUS | Status: DC
  Administered 2016-01-12 (×2): via INTRAVENOUS

## 2016-01-12 MED ORDER — MIDAZOLAM HCL 2 MG/2ML IJ SOLN
INTRAMUSCULAR | Status: AC
  Filled 2016-01-12: qty 2

## 2016-01-12 MED ORDER — MUPIROCIN 2 % EX OINT
TOPICAL_OINTMENT | CUTANEOUS | Status: AC
  Filled 2016-01-12: qty 22

## 2016-01-12 MED ORDER — ONDANSETRON HCL 4 MG/2ML IJ SOLN: 4.0000 mg | Freq: Once | INTRAMUSCULAR | Status: DC | PRN

## 2016-01-12 MED ORDER — PROPOFOL 10 MG/ML IV BOLUS
INTRAVENOUS | Status: DC | PRN
  Administered 2016-01-12 (×2): 10 mg via INTRAVENOUS

## 2016-01-12 MED ORDER — ALTEPLASE 2 MG IJ SOLR
2.0000 mg | Freq: Once | INTRAMUSCULAR | Status: DC | PRN
  Filled 2016-01-12: qty 2

## 2016-01-12 MED ORDER — SODIUM CHLORIDE 0.9 % IV SOLN: 100.0000 mL | INTRAVENOUS | Status: DC | PRN

## 2016-01-12 MED ORDER — PROPOFOL 10 MG/ML IV BOLUS
INTRAVENOUS | Status: AC
  Filled 2016-01-12: qty 20

## 2016-01-12 MED ORDER — HEPARIN SODIUM (PORCINE) 1000 UNIT/ML DIALYSIS
20.0000 [IU]/kg | INTRAMUSCULAR | Status: DC | PRN
  Filled 2016-01-12: qty 2

## 2016-01-12 MED ORDER — RIFAMPIN 300 MG PO CAPS
600.0000 mg | ORAL_CAPSULE | Freq: Every day | ORAL | Status: DC
  Administered 2016-01-12: 600 mg via ORAL
  Filled 2016-01-12 (×3): qty 2

## 2016-01-12 MED ORDER — PROPOFOL 500 MG/50ML IV EMUL
INTRAVENOUS | Status: DC | PRN
  Administered 2016-01-12: 50 ug/kg/min via INTRAVENOUS

## 2016-01-12 MED ORDER — MIDAZOLAM HCL 5 MG/5ML IJ SOLN
INTRAMUSCULAR | Status: DC | PRN
  Administered 2016-01-12: 2 mg via INTRAVENOUS

## 2016-01-12 MED ORDER — MIDAZOLAM HCL 2 MG/2ML IJ SOLN
1.0000 mg | INTRAMUSCULAR | Status: DC | PRN
Start: 2016-01-12 — End: 2016-01-13

## 2016-01-12 MED ORDER — LACTATED RINGERS IV SOLN
INTRAVENOUS | Status: DC
  Administered 2016-01-12: 23:00:00 via INTRAVENOUS

## 2016-01-12 NOTE — Progress Notes (Signed)
[facility] PROGRESS NOTE  Victor White PIR:[id number] DOB: October 15, 1966 DOA: 01/08/2016 PCP: No primary care provider on file.  Assessment/Plan: Sepsis/MRSA bacteremia -Continue vancomycin, DC Ancef -Repeat BC are negative at 48 hours. -Will request PICC line. -TEE negative for vegetations of native valves or AICD. -ID has recommended addition of rifampin to his 3 weeks of vanc. Stop date 3/18.  End-stage renal disease -on hemodialysis Tuesday Thursday Saturday. -Discussed with nephrology need for dialysis as an inpatient.  Diabetes mellitus -Fair control, continue current regimen and adjust as needed.  Code Status: Full code Family Communication: Patient only  Disposition Plan: Back to SNF once ready   Consultants:  None   Antibiotics:  Vancomycin   Subjective:  Anxious for DC.  Objective: Filed Vitals:   01/12/16 1300 01/12/16 1351 01/12/16 1400 01/12/16 1434  BP:  126/61 111/61 108/67  Pulse:  72 76 88  Temp:    97.7 F (36.5 C)  TempSrc:    Oral  Resp: 12 12 16 18   Height:      Weight:      SpO2: 95% 94% 95% 100%    Intake/Output Summary (Last 24 hours) at 01/12/16 1535 Last data filed at 01/12/16 1353  Gross per 24 hour  Intake    520 ml  Output    200 ml  Net    320 ml   Filed Weights   01/10/16 1455 01/11/16 0540 01/12/16 1153  Weight: 81.6 kg (179 lb 14.3 oz) 80.9 kg (178 lb 5.6 oz) 80.74 kg (178 lb)    Exam:   General:  Alert, awake, oriented 3  Cardiovascular: Regular rate and rhythm  Respiratory: Clear to auscultation bilaterally  Abdomen: Soft, nontender, nondistended, positive bowel sounds  Extremities: No clubbing, cyanosis or edema   Neurologic:  Grossly intact and nonfocal  Data Reviewed: Basic Metabolic Panel:  Recent Labs Lab 01/08/16 1715 01/09/16 0233 01/10/16 0547  NA 135 134* 135  K 4.7 3.5 4.0  CL 89* 95* 93*  CO2 32 26 27  GLUCOSE 192* 157* 106*  BUN 32* 41* 57*  CREATININE 5.00* 5.66*  7.03*  CALCIUM 10.1 9.0 9.4   Liver Function Tests:  Recent Labs Lab 01/08/16 1715  AST 28  ALT 17  ALKPHOS 137*  BILITOT 0.9  PROT 9.2*  ALBUMIN 3.5   No results for input(s): LIPASE, AMYLASE in the last 168 hours. No results for input(s): AMMONIA in the last 168 hours. CBC:  Recent Labs Lab 01/08/16 1715 01/09/16 0233 01/10/16 0547  WBC 11.2* 14.6* 10.6*  NEUTROABS 9.6* 12.3*  --   HGB 15.0 12.8* 12.9*  HCT 43.4 37.6* 37.7*  MCV 94.6 95.4 94.3  PLT 141* 115* 125*   Cardiac Enzymes: No results for input(s): CKTOTAL, CKMB, CKMBINDEX, TROPONINI in the last 168 hours. BNP (last 3 results) No results for input(s): BNP in the last 8760 hours.  ProBNP (last 3 results) No results for input(s): PROBNP in the last 8760 hours.  CBG:  Recent Labs Lab 01/11/16 1643 01/11/16 2222 01/12/16 0719 01/12/16 1201 01/12/16 1357  GLUCAP 160* 121* 116* 134* 88    Recent Results (from the past 240 hour(s))  Blood Culture (routine x 2)     Status: None   Collection Time: 01/08/16  5:15 PM  Result Value Ref Range Status   Specimen Description BLOOD LEFT ANTECUBITAL DRAWN BY RN  Final   Special Requests BOTTLES DRAWN AEROBIC AND ANAEROBIC [facility] EACH  Final   Culture  Setup Time   Final    GRAM POSITIVE COCCI IN CLUSTERS IN BOTH AEROBIC AND ANAEROBIC BOTTLES CRITICAL RESULT CALLED TO, READ BACK BY AND VERIFIED WITH: BULLIN,M AT 1009 ON 01/09/2016 BY WOODS,M    Culture   Final    METHICILLIN RESISTANT STAPHYLOCOCCUS AUREUS Performed at [facility]    Report Status 01/11/2016 FINAL  Final   Organism ID, Bacteria METHICILLIN RESISTANT STAPHYLOCOCCUS AUREUS  Final      Susceptibility   Methicillin resistant staphylococcus aureus - MIC*    CIPROFLOXACIN >=8 RESISTANT Resistant     ERYTHROMYCIN >=8 RESISTANT Resistant     GENTAMICIN >=16 RESISTANT Resistant     OXACILLIN >=4 RESISTANT Resistant     TETRACYCLINE <=1 SENSITIVE Sensitive     VANCOMYCIN <=0.5 SENSITIVE  Sensitive     TRIMETH/SULFA >=320 RESISTANT Resistant     CLINDAMYCIN <=0.25 SENSITIVE Sensitive     RIFAMPIN <=0.5 SENSITIVE Sensitive     Inducible Clindamycin NEGATIVE Sensitive     * METHICILLIN RESISTANT STAPHYLOCOCCUS AUREUS  Blood Culture (routine x 2)     Status: None   Collection Time: 01/08/16  5:16 PM  Result Value Ref Range Status   Specimen Description BLOOD RIGHT HAND  Final   Special Requests BOTTLES DRAWN AEROBIC ONLY 8CC  Final   Culture  Setup Time   Final    GRAM POSITIVE COCCI IN CLUSTERS AEROBIC BOTTLE ONLY CRITICAL RESULT CALLED TO, READ BACK BY AND VERIFIED WITH: BULILNS, M. AT 1233 ON 01/09/2016 BY AGUNDIZ, E.    Culture   Final    STAPHYLOCOCCUS AUREUS SUSCEPTIBILITIES PERFORMED ON PREVIOUS CULTURE WITHIN THE LAST 5 DAYS. Performed at [facility]    Report Status 01/11/2016 FINAL  Final  Urine culture     Status: None   Collection Time: 01/08/16  5:19 PM  Result Value Ref Range Status   Specimen Description URINE, CLEAN CATCH  Final   Special Requests NONE  Final   Culture   Final    MULTIPLE SPECIES PRESENT, SUGGEST RECOLLECTION Performed at [facility]    Report Status 01/10/2016 FINAL  Final  MRSA PCR Screening     Status: Abnormal   Collection Time: 01/09/16 12:29 AM  Result Value Ref Range Status   MRSA by PCR POSITIVE (A) NEGATIVE Final    Comment:        The GeneXpert MRSA Assay (FDA approved for NASAL specimens only), is one component of a comprehensive MRSA colonization surveillance program. It is not intended to diagnose MRSA infection nor to guide or monitor treatment for MRSA infections. RESULT CALLED TO, READ BACK BY AND VERIFIED WITH: STURDIVANT D AT 0226 ON [id number] BY FORSYTH K   Culture, blood (routine x 2)     Status: None (Preliminary result)   Collection Time: 01/10/16 11:18 AM  Result Value Ref Range Status   Specimen Description LEFT ANTECUBITAL  Final   Special Requests BOTTLES DRAWN AEROBIC AND  ANAEROBIC 8CC  Final   Culture NO GROWTH 2 DAYS  Final   Report Status PENDING  Incomplete  Culture, blood (routine x 2)     Status: None (Preliminary result)   Collection Time: 01/10/16  3:00 PM  Result Value Ref Range Status   Specimen Description BLOOD RIGHT FOREARM  Final   Special Requests BOTTLES DRAWN AEROBIC AND ANAEROBIC 8CC EACH  Final   Culture NO GROWTH 2 DAYS  Final   Report Status PENDING  Incomplete     Studies:  No results found.  Scheduled Meds: . acetaminophen  1,000 mg Oral Once  . atorvastatin  10 mg Oral Daily  . carvedilol  6.25 mg Oral BID WC  . famotidine  20 mg Oral Daily  . gabapentin  100 mg Oral Daily  . heparin  5,000 Units Subcutaneous 3 times per day  . hydrALAZINE  25 mg Oral TID  . insulin aspart  0-9 Units Subcutaneous TID WC  . isosorbide dinitrate  10 mg Oral BID  . pantoprazole  40 mg Oral Daily  . prasugrel  10 mg Oral Daily  . rifampin  600 mg Oral Daily  . sevelamer carbonate  2,400 mg Oral TID  . sodium chloride flush  3 mL Intravenous Q12H  . sodium chloride flush  3 mL Intravenous Q12H  . traZODone  50 mg Oral QHS  . vancomycin  750 mg Intravenous Q T,Th,Sa-HD   Continuous Infusions:   Principal Problem:   Staphylococcus aureus bacteremia Active Problems:   ESRD (end stage renal disease) on dialysis (HCC)   Thrombocytopenia (HCC)   Insulin dependent diabetes mellitus (HCC)   Hypertension   Sepsis (HCC)   Normocytic anemia   AICD (automatic cardioverter/defibrillator) present   Cigarette smoker    Time spent: 25 minutes. Greater than 50% of this time was spent in direct contact with the patient coordinating care.    Chaya Jan  [facility] Pager [phone]  If 7PM-7AM, please contact night-coverage at [email or web address], password [facility] 01/12/2016, 3:35 PM  LOS: 4 days

## 2016-01-12 NOTE — Anesthesia Preprocedure Evaluation (Addendum)
Anesthesia Evaluation  Patient identified by MRN, date of birth, ID band Patient awake    Reviewed: Allergy & Precautions, NPO status , Patient's Chart, lab work & pertinent test results  History of Anesthesia Complications Negative for: history of anesthetic complications  Airway Mallampati: II  TM Distance: >3 FB Neck ROM: Full    Dental  (+) Poor Dentition, Missing, Dental Advisory Given   Pulmonary Current Smoker,    Pulmonary exam normal        Cardiovascular hypertension, + CAD and +CHF  Normal cardiovascular exam+ Cardiac Defibrillator   Study Conclusions  - Left ventricle: The cavity size was severely dilated. The estimated ejection fraction was 15%. Diffuse hypokinesis. Doppler parameters are consistent with restrictive physiology, indicative of decreased left ventricular diastolic compliance and/or increased left atrial pressure. - Mitral valve: There was mild regurgitation. - Left atrium: The atrium was moderately dilated. - Right ventricle: The cavity size was moderately dilated. Systolic function was moderately to severely reduced. - Pulmonic valve: There was moderate regurgitation.    Neuro/Psych negative neurological ROS  negative psych ROS   GI/Hepatic negative GI ROS, Neg liver ROS,   Endo/Other  diabetes  Renal/GU ESRF and DialysisRenal disease     Musculoskeletal   Abdominal   Peds  Hematology   Anesthesia Other Findings   Reproductive/Obstetrics                           Anesthesia Physical Anesthesia Plan  ASA: III  Anesthesia Plan: MAC   Post-op Pain Management:    Induction: Intravenous  Airway Management Planned: Simple Face Mask  Additional Equipment:   Intra-op Plan:   Post-operative Plan:   Informed Consent: I have reviewed the patients History and Physical, chart, labs and discussed the procedure including the risks, benefits and alternatives  for the proposed anesthesia with the patient or authorized representative who has indicated his/her understanding and acceptance.   Dental advisory given  Plan Discussed with: Anesthesiologist  Anesthesia Plan Comments:        Anesthesia Quick Evaluation

## 2016-01-12 NOTE — Anesthesia Postprocedure Evaluation (Signed)
Anesthesia Post Note  Patient: JIN CAPOTE  Procedure(s) Performed: Procedure(s) (LRB): TRANSESOPHAGEAL ECHOCARDIOGRAM (TEE) WITH PROPOFOL (N/A)  Patient location during evaluation: PACU Anesthesia Type: MAC Level of consciousness: awake Pain management: pain level controlled Vital Signs Assessment: post-procedure vital signs reviewed and stable Respiratory status: spontaneous breathing and patient connected to nasal cannula oxygen Cardiovascular status: blood pressure returned to baseline Postop Assessment: no signs of nausea or vomiting Anesthetic complications: no    Last Vitals:  Filed Vitals:   01/12/16 1113 01/12/16 1153  BP: 109/62 110/68  Pulse: 84 89  Temp: 36.7 C 36.7 C  Resp: 18 18    Last Pain:  Filed Vitals:   01/12/16 1212  PainSc: Asleep                 Shepard Keltz

## 2016-01-12 NOTE — Interval H&P Note (Signed)
History and Physical Interval Note:  01/12/2016 1:18 PM  Victor White  has presented today for surgery, with the diagnosis of bacteremia  The various methods of treatment have been discussed with the patient and family. After consideration of risks, benefits and other options for treatment, the patient has consented to  Procedure(s): TRANSESOPHAGEAL ECHOCARDIOGRAM (TEE) WITH PROPOFOL (N/A) as a surgical intervention .  The patient's history has been reviewed, patient examined, no change in status, stable for surgery.  I have reviewed the patient's chart and labs.  Questions were answered to the patient's satisfaction.     Nona Dell

## 2016-01-12 NOTE — Progress Notes (Signed)
accu check blood sugar 88. Dr Krista Blue notified. No new orders given.

## 2016-01-12 NOTE — Progress Notes (Signed)
Patient ID: Victor White, male   DOB: Sep 07, 1966, 50 y.o.   MRN: [id number]         [facility] for Infectious Disease     Date of Admission:  01/08/2016   Total days of antibiotics 4         He has defervesced on therapy for MRSA bacteremia. Repeat blood cultures are negative at 48 hours. There was no evidence of vegetations on his native valves or AICD leads with TEE today. I recommend 3 weeks of total antibiotic therapy following his first negative blood cultures. Given the presence of hardware I will also add rifampin to his regimen. He should be warned about R) discoloration of his urine while on rifampin.  Plan: 1. Continue vancomycin along with oral rifampin through 01/31/2016 2. I will sign off now         [name], MD [facility] for Infectious Disease [facility] [phone] pager   [phone] cell 11/18/2015, 1:32 PM

## 2016-01-12 NOTE — Consult Note (Signed)
Requesting provider: Dr. Henderson Cloud Consulting cardiologist: Dr. 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 He is presently admitted to the hospital with MRSA bacteremia, has defervesced on vancomycin with follow-up blood cultures negative so far. Transthoracic echocardiogram reports LVEF of 15% with diffuse hypokinesis, restrictive diastolic filling pattern, and no obvious valvular vegetations. TEE has been requested for further evaluation particularly with ICD in place.  Procedure discussed with patient including potential risks and benefits. He does not report any definite swallowing problems, no known esophageal pathology. No history of IV drug abuse noted. He is on Percocet and trazodone as an outpatient.  No Known Allergies  Medications Scheduled Medications: . acetaminophen  1,000 mg Oral Once  . atorvastatin  10 mg Oral Daily  . carvedilol  6.25 mg Oral BID WC  . famotidine  20 mg Oral Daily  . gabapentin  100 mg Oral Daily  . heparin  5,000 Units Subcutaneous 3 times per day  . hydrALAZINE  25 mg Oral TID  . insulin aspart  0-9 Units Subcutaneous TID WC  . isosorbide dinitrate  10 mg Oral BID  . pantoprazole  40 mg Oral Daily  . prasugrel  10 mg Oral Daily  . sevelamer carbonate  2,400 mg Oral TID  . sodium chloride flush  3 mL Intravenous Q12H  . sodium chloride flush  3 mL Intravenous Q12H  . traZODone  50 mg Oral QHS  . vancomycin  750 mg Intravenous Q T,Th,Sa-HD     PRN Medications:  sodium chloride, sodium chloride, sodium  chloride, acetaminophen **OR** acetaminophen, bisacodyl, lidocaine (PF), lidocaine-prilocaine, ondansetron **OR** ondansetron (ZOFRAN) IV, oxyCODONE, pentafluoroprop-tetrafluoroeth, polyethylene glycol, sodium chloride flush   Past Medical History  Diagnosis Date  . Type 2 diabetes mellitus (HCC)   . Essential hypertension   . ESRD on hemodialysis (HCC)   . ICD (implantable cardioverter-defibrillator) in place   . Cardiomyopathy (HCC)   . CAD (coronary artery disease)     Past Surgical History  Procedure Laterality Date  . Insert / replace / remove pacemaker      Family History  Problem Relation Age of Onset  . Diabetes type II Other     Social History Victor White reports that he has been smoking Cigarettes.  He does not have any smokeless tobacco history on file. Victor White reports that he does not drink alcohol.  Review of Systems Complete review of systems negative except as otherwise outlined in the clinical summary and also the following. Fatigue, intermittent dyspnea on exertion and NYHA class 2-3, no chest pain, no recent device discharges or syncope.  Physical Examination Blood pressure 95/54, pulse 83, temperature 98.4 F (36.9 C), temperature source Oral, resp. rate 20, height 6' (1.829 m), weight 178 lb 5.6 oz (80.9 kg), SpO2 96 %.  Intake/Output Summary (Last 24 hours) at 01/12/16 0807 Last data filed at 01/12/16 0630  Gross per 24 hour  Intake    240 ml  Output    200 ml  Net     40 ml   Gen.: Chronically ill appearing somewhat disheveled male in no distress. HEENT: Conjunctiva and lids  normal oropharynx clear with poor dentition. Neck: Supple, mildly elevated JVP. no carotid bruits, no thyromegaly. Lungs: Clear to auscultation, nonlabored breathing at rest. Cardiac: Indistinct PMI, RRR, no S3, 2/6 apical systolic murmur, no pericardial rub. Abdomen: Soft, nontender, bowel sounds present, no guarding or rebound. Extremities: Mild leg edema, status post  bilateral transmetatarsal amputations. Skin: Warm and dry. Musculoskeletal: No kyphosis. Neuropsychiatric: Alert and oriented x3, affect grossly appropriate.  Lab Results  Basic Metabolic Panel:  Recent Labs Lab 01/08/16 1715 01/09/16 0233 01/10/16 0547  NA 135 134* 135  K 4.7 3.5 4.0  CL 89* 95* 93*  CO2 32 26 27  GLUCOSE 192* 157* 106*  BUN 32* 41* 57*  CREATININE 5.00* 5.66* 7.03*  CALCIUM 10.1 9.0 9.4    Liver Function Tests:  Recent Labs Lab 01/08/16 1715  AST 28  ALT 17  ALKPHOS 137*  BILITOT 0.9  PROT 9.2*  ALBUMIN 3.5    CBC:  Recent Labs Lab 01/08/16 1715 01/09/16 0233 01/10/16 0547  WBC 11.2* 14.6* 10.6*  NEUTROABS 9.6* 12.3*  --   HGB 15.0 12.8* 12.9*  HCT 43.4 37.6* 37.7*  MCV 94.6 95.4 94.3  PLT 141* 115* 125*    ECG I personally reviewed the tracing from 01/08/2016 which showed sinus tachycardia with LVH and repolarization abnormalities.  Imaging Chest x-ray 01/08/2016: FINDINGS: Cardiac silhouette is mildly enlarged. No mediastinal or hilar masses or evidence of adenopathy. Left anterior chest wall cardioverter-defibrillator is stable.  Clear lungs. No pleural effusion or pneumothorax.  Bony thorax is intact.  IMPRESSION: No acute cardiopulmonary disease.  Echocardiogram 01/11/2016: Study Conclusions  - Left ventricle: The cavity size was severely dilated. The estimated ejection fraction was 15%. Diffuse hypokinesis. Doppler parameters are consistent with restrictive physiology, indicative of decreased left ventricular diastolic compliance and/or increased left atrial pressure. - Mitral valve: There was mild regurgitation. - Left atrium: The atrium was moderately dilated. - Right ventricle: The cavity size was moderately dilated. Systolic function was moderately to severely reduced. - Pulmonic valve: There was moderate regurgitation.  Impression  1. MRSA bacteremia, currently afebrile on vancomycin. TEE has  been requested to evaluate for endocarditis with ICD in place. Transthoracic study did not demonstrate any obvious vegetations.  2. History of cardiomyopathy status post ICD placement in [facility], details are not available as yet. Limited chart information indicates possible ischemic cardiomyopathy with CAD, although his transthoracic echocardiogram showed severe LV chamber dilatation with diffuse hypokinesis, LVEF 15% and restrictive diastolic filling pattern - question possible nonischemic cardiomyopathy.  3. End-stage renal disease on hemodialysis.  4. Type 2 diabetes mellitus with neuropathy and prior history of osteomyelitis with bilateral transmetatarsal amputations.  Recommendations  Discussed procedure with patient and also updated hospitalist team. Victor White is initially stating that he wants to go home today. If this is not the case and he ultimately stays, we will try and schedule TEE in concert with the anesthesia team to provide adequate sedation. Risks and benefits discussed.  Jonelle Sidle, M.D., F.A.C.C.

## 2016-01-12 NOTE — CV Procedure (Signed)
Transesophageal echocardiogram  Indication: Bacteremia  Description of procedure: Patient seen and examined pre-sedation. Informed consent obtained. Anesthesia service present for initiation and management of sedation. Please refer to their documentation for details. Procedure performed in PACU. Oropharynx anesthetized with Cetacaine spray. Bite block utilized. Once adequate sedation was obtained, multiplane TEE probe was easily advanced into the esophagus. Multiple images were obtained, please refer to final report. No valvular vegetations were intensified. Visualized portions of the right heart ICD lead did not demonstrate any obvious vegetations. Patient tolerated the procedure well without immediate complications.  Jonelle Sidle, M.D., F.A.C.C.

## 2016-01-12 NOTE — H&P (View-Only) (Signed)
Requesting provider: Dr. Henderson White Consulting cardiologist: Victor White  Reason for consultation: TEE requested  Clinical Summary [name] is a 50 y.o.male with limited medical history indicating cardiomyopathy status post previous ICD placement in [place] (patient does not report any clear details about history and medical records are not yet available), CAD, ESRD on hemodialysis, and type 2 diabetes mellitus with neuropathy status post bilateral metatarsal amputations with previously documented osteomyelitis. He is presently admitted to the hospital with MRSA bacteremia, has defervesced on vancomycin with follow-up blood cultures negative so far. Transthoracic echocardiogram reports LVEF of 15% with diffuse hypokinesis, restrictive diastolic filling pattern, and no obvious valvular vegetations. TEE has been requested for further evaluation particularly with ICD in place.  Procedure discussed with patient including potential risks and benefits. He does not report any definite swallowing problems, no known esophageal pathology. No history of IV drug abuse noted. He is on Percocet and trazodone as an outpatient.  No Known Allergies  Medications Scheduled Medications: . acetaminophen  1,000 mg Oral Once  . atorvastatin  10 mg Oral Daily  . carvedilol  6.25 mg Oral BID WC  . famotidine  20 mg Oral Daily  . gabapentin  100 mg Oral Daily  . heparin  5,000 Units Subcutaneous 3 times per day  . hydrALAZINE  25 mg Oral TID  . insulin aspart  0-9 Units Subcutaneous TID WC  . isosorbide dinitrate  10 mg Oral BID  . pantoprazole  40 mg Oral Daily  . prasugrel  10 mg Oral Daily  . sevelamer carbonate  2,400 mg Oral TID  . sodium chloride flush  3 mL Intravenous Q12H  . sodium chloride flush  3 mL Intravenous Q12H  . traZODone  50 mg Oral QHS  . vancomycin  750 mg Intravenous Q T,Th,Sa-HD     PRN Medications:  sodium chloride, sodium chloride, sodium  chloride, acetaminophen **OR** acetaminophen, bisacodyl, lidocaine (PF), lidocaine-prilocaine, ondansetron **OR** ondansetron (ZOFRAN) IV, oxyCODONE, pentafluoroprop-tetrafluoroeth, polyethylene glycol, sodium chloride flush   Past Medical History  Diagnosis Date  . Type 2 diabetes mellitus (HCC)   . Essential hypertension   . ESRD on hemodialysis (HCC)   . ICD (implantable cardioverter-defibrillator) in place   . Cardiomyopathy (HCC)   . CAD (coronary artery disease)     Past Surgical History  Procedure Laterality Date  . Insert / replace / remove pacemaker      Family History  Problem Relation Age of Onset  . Diabetes type II Other     Social History Victor White reports that he has been smoking Cigarettes.  He does not have any smokeless tobacco history on file. Victor White reports that he does not drink alcohol.  Review of Systems Complete review of systems negative except as otherwise outlined in the clinical summary and also the following. Fatigue, intermittent dyspnea on exertion and NYHA class 2-3, no chest pain, no recent device discharges or syncope.  Physical Examination Blood pressure 95/54, pulse 83, temperature 98.4 F (36.9 C), temperature source Oral, resp. rate 20, height 6' (1.829 m), weight 178 lb 5.6 oz (80.9 kg), SpO2 96 %.  Intake/Output Summary (Last 24 hours) at 01/12/16 0807 Last data filed at 01/12/16 0630  Gross per 24 hour  Intake    240 ml  Output    200 ml  Net     40 ml   Gen.: Chronically ill appearing somewhat disheveled male in no distress. HEENT: Conjunctiva and lids  normal oropharynx clear with poor dentition. Neck: Supple, mildly elevated JVP. no carotid bruits, no thyromegaly. Lungs: Clear to auscultation, nonlabored breathing at rest. Cardiac: Indistinct PMI, RRR, no S3, 2/6 apical systolic murmur, no pericardial rub. Abdomen: Soft, nontender, bowel sounds present, no guarding or rebound. Extremities: Mild leg edema, status post  bilateral transmetatarsal amputations. Skin: Warm and dry. Musculoskeletal: No kyphosis. Neuropsychiatric: Alert and oriented x3, affect grossly appropriate.  Lab Results  Basic Metabolic Panel:  Recent Labs Lab 01/08/16 1715 01/09/16 0233 01/10/16 0547  NA 135 134* 135  K 4.7 3.5 4.0  CL 89* 95* 93*  CO2 32 26 27  GLUCOSE 192* 157* 106*  BUN 32* 41* 57*  CREATININE 5.00* 5.66* 7.03*  CALCIUM 10.1 9.0 9.4    Liver Function Tests:  Recent Labs Lab 01/08/16 1715  AST 28  ALT 17  ALKPHOS 137*  BILITOT 0.9  PROT 9.2*  ALBUMIN 3.5    CBC:  Recent Labs Lab 01/08/16 1715 01/09/16 0233 01/10/16 0547  WBC 11.2* 14.6* 10.6*  NEUTROABS 9.6* 12.3*  --   HGB 15.0 12.8* 12.9*  HCT 43.4 37.6* 37.7*  MCV 94.6 95.4 94.3  PLT 141* 115* 125*    ECG I personally reviewed the tracing from 01/08/2016 which showed sinus tachycardia with LVH and repolarization abnormalities.  Imaging Chest x-ray 01/08/2016: FINDINGS: Cardiac silhouette is mildly enlarged. No mediastinal or hilar masses or evidence of adenopathy. Left anterior chest wall cardioverter-defibrillator is stable.  Clear lungs. No pleural effusion or pneumothorax.  Bony thorax is intact.  IMPRESSION: No acute cardiopulmonary disease.  Echocardiogram 01/11/2016: Study Conclusions  - Left ventricle: The cavity size was severely dilated. The estimated ejection fraction was 15%. Diffuse hypokinesis. Doppler parameters are consistent with restrictive physiology, indicative of decreased left ventricular diastolic compliance and/or increased left atrial pressure. - Mitral valve: There was mild regurgitation. - Left atrium: The atrium was moderately dilated. - Right ventricle: The cavity size was moderately dilated. Systolic function was moderately to severely reduced. - Pulmonic valve: There was moderate regurgitation.  Impression  1. MRSA bacteremia, currently afebrile on vancomycin. TEE has  been requested to evaluate for endocarditis with ICD in place. Transthoracic study did not demonstrate any obvious vegetations.  2. History of cardiomyopathy status post ICD placement in [place], details are not available as yet. Limited chart information indicates possible ischemic cardiomyopathy with CAD, although his transthoracic echocardiogram showed severe LV chamber dilatation with diffuse hypokinesis, LVEF 15% and restrictive diastolic filling pattern - question possible nonischemic cardiomyopathy.  3. End-stage renal disease on hemodialysis.  4. Type 2 diabetes mellitus with neuropathy and prior history of osteomyelitis with bilateral transmetatarsal amputations.  Recommendations  Discussed procedure with patient and also updated hospitalist team. Mr. Rudge is initially stating that he wants to go home today. If this is not the case and he ultimately stays, we will try and schedule TEE in concert with the anesthesia team to provide adequate sedation. Risks and benefits discussed.  Victor White, M.D., F.A.C.C.  

## 2016-01-12 NOTE — Transfer of Care (Signed)
Immediate Anesthesia Transfer of Care Note  Patient: Victor White  Procedure(s) Performed: Procedure(s): TRANSESOPHAGEAL ECHOCARDIOGRAM (TEE) WITH PROPOFOL (N/A)  Patient Location: PACU  Anesthesia Type:MAC  Level of Consciousness: awake  Airway & Oxygen Therapy: Patient Spontanous Breathing and Patient connected to nasal cannula oxygen  Post-op Assessment: Report given to RN  Post vital signs: Reviewed and stable  Last Vitals:  Filed Vitals:   01/12/16 1113 01/12/16 1153  BP: 109/62 110/68  Pulse: 84 89  Temp: 36.7 C 36.7 C  Resp: 18 18    Complications: No apparent anesthesia complications

## 2016-01-12 NOTE — Progress Notes (Signed)
Subjective: Patient complains of some abdominal pain today. He doesn't have any nausea vomiting or diarrhea. The pain comes and goes and described as discomfort. Presently he does not any radiation of his pain. The last time he moved his bowel was yesterday night. Presently patient denies any difficulty in breathing.   Objective: Vital signs in last 24 hours: Temp:  [97.9 F (36.6 C)-98.5 F (36.9 C)] 98.4 F (36.9 C) (02/27 0506) Pulse Rate:  [83-86] 83 (02/27 0506) Resp:  [16-20] 20 (02/27 0506) BP: (85-99)/(54-57) 95/54 mmHg (02/27 0506) SpO2:  [96 %-99 %] 96 % (02/27 0506)  Intake/Output from previous day: 02/26 0701 - 02/27 0700 In: 240 [P.O.:240] Out: 200 [Urine:200] Intake/Output this shift:     Recent Labs  01/10/16 0547  HGB 12.9*    Recent Labs  01/10/16 0547  WBC 10.6*  RBC 4.00*  HCT 37.7*  PLT 125*    Recent Labs  01/10/16 0547  NA 135  K 4.0  CL 93*  CO2 27  BUN 57*  CREATININE 7.03*  GLUCOSE 106*  CALCIUM 9.4   No results for input(s): LABPT, INR in the last 72 hours.  Generally patient is alert and in no apparent distress Chest is clear to auscultation His heart exam revealed regular rate and rhythm. No murmur Abdomen: Soft, nontender and positive bowel sound Extremities no edema  Assessment/Plan: Problem #1 end-stage dialysis: He is status post hemodialysis on Saturday. Presently patient doesn't have any nausea or vomiting. Problem #2 hypertension: His blood pressure is reasonably controlled Problem #3 history of staph aureus bacteremia. Presently patient is afebrile. Source of infection at this moment is not clear. Problem #4 history of insulin dependent diabetes Problem #5 history ofA ICD placement Problem #6 anemia: His hemoglobin is above our target goal. Plan: 1]We'll check CBC and renal panel in the morning. 2] We will make arrangements for patient to get dialysis tomorrow which is his regular schedule.  Victor White  S 01/12/2016, 7:57 AM

## 2016-01-13 LAB — RENAL FUNCTION PANEL
Albumin: 2.8 g/dL — ABNORMAL LOW (ref 3.5–5.0)
Anion gap: 11 (ref 5–15)
BUN: 52 mg/dL — AB (ref 6–20)
CALCIUM: 9.3 mg/dL (ref 8.9–10.3)
CHLORIDE: 99 mmol/L — AB (ref 101–111)
CO2: 28 mmol/L (ref 22–32)
CREATININE: 7.98 mg/dL — AB (ref 0.61–1.24)
GFR calc Af Amer: 8 mL/min — ABNORMAL LOW (ref >60)
GFR calc non Af Amer: 7 mL/min — ABNORMAL LOW (ref >60)
GLUCOSE: 101 mg/dL — AB (ref 65–99)
Phosphorus: 5.6 mg/dL — ABNORMAL HIGH (ref 2.5–4.6)
Potassium: 4 mmol/L (ref 3.5–5.1)
SODIUM: 138 mmol/L (ref 135–145)

## 2016-01-13 LAB — CBC
HCT: 35.5 % — ABNORMAL LOW (ref 39.0–52.0)
Hemoglobin: 12.2 g/dL — ABNORMAL LOW (ref 13.0–17.0)
MCH: 32.4 pg (ref 26.0–34.0)
MCHC: 34.4 g/dL (ref 30.0–36.0)
MCV: 94.4 fL (ref 78.0–100.0)
PLATELETS: 149 10*3/uL — AB (ref 150–400)
RBC: 3.76 MIL/uL — ABNORMAL LOW (ref 4.22–5.81)
RDW: 17 % — AB (ref 11.5–15.5)
WBC: 3.9 10*3/uL — ABNORMAL LOW (ref 4.0–10.5)

## 2016-01-13 LAB — GLUCOSE, CAPILLARY
GLUCOSE-CAPILLARY: 124 mg/dL — AB (ref 65–99)
Glucose-Capillary: 95 mg/dL (ref 65–99)
Glucose-Capillary: 100 mg/dL — ABNORMAL HIGH (ref 65–99)

## 2016-01-13 LAB — RESPIRATORY VIRUS PANEL
Adenovirus: NEGATIVE
Influenza A: NEGATIVE
Influenza B: NEGATIVE
METAPNEUMOVIRUS: NEGATIVE
PARAINFLUENZA 2 A: NEGATIVE
PARAINFLUENZA 3 A: NEGATIVE
Parainfluenza 1: NEGATIVE
RESPIRATORY SYNCYTIAL VIRUS A: NEGATIVE
RHINOVIRUS: POSITIVE — AB
Respiratory Syncytial Virus B: NEGATIVE

## 2016-01-13 MED ORDER — VANCOMYCIN HCL IN DEXTROSE 750-5 MG/150ML-% IV SOLN
750.0000 mg | INTRAVENOUS | Status: AC
Start: 2016-01-13 — End: 2016-01-31

## 2016-01-13 MED ORDER — VANCOMYCIN HCL IN DEXTROSE 750-5 MG/150ML-% IV SOLN: 750.0000 mg | INTRAVENOUS | Status: DC

## 2016-01-13 MED ORDER — RIFAMPIN 300 MG PO CAPS: 600.0000 mg | ORAL_CAPSULE | Freq: Every day | ORAL | Status: AC

## 2016-01-13 NOTE — Care Management (Signed)
Discharge summary and Vancomycin RX orders faxed to Rosanne Ashing at [facility]. [phone]

## 2016-01-13 NOTE — Discharge Summary (Signed)
Physician Discharge Summary  [name]:[id number] DOB: Apr 09, 1966 DOA: 01/08/2016  PCP: No primary care provider on file.  Admit date: 01/08/2016 Discharge date: 01/13/2016  Time spent: 45 minutes  Recommendations for Outpatient Follow-up:  -Will be discharged back to SNF today after HD. -Will need to complete a course of vancomycin and rifampin until 01/31/16.   Discharge Diagnoses:  Principal Problem:   Staphylococcus aureus bacteremia Active Problems:   ESRD (end stage renal disease) on dialysis (HCC)   Thrombocytopenia (HCC)   Insulin dependent diabetes mellitus (HCC)   Hypertension   Sepsis (HCC)   Normocytic anemia   AICD (automatic cardioverter/defibrillator) present   Cigarette smoker   Discharge Condition: Stable and improved  Filed Weights   01/12/16 1153 01/13/16 0453 01/13/16 1240  Weight: 80.74 kg (178 lb) 80.513 kg (177 lb 8 oz) 80.6 kg (177 lb 11.1 oz)    History of present illness:  As per Dr. Antionette Char on 2/23: Victor White is a 50 y.o. male with PMH of insulin-dependent diabetes mellitus, hypertension, and end-stage renal disease on hemodialysis who presents from his dialysis center with fevers and left foot pain. Patient recently moved here from [place], [place] and has not yet established with a PCP. He notes fatigue, chills, and exquisite tenderness to the left foot for the past 3 days, but had otherwise been in his usual state. He denies chest pain, palpitations, cough, or dyspnea. Patient still makes urine but denies dysuria, hematuria, or increased urinary urgency or frequency. He is status post bilateral transmetatarsal amputations and notes severe pain at the left foot but denies the presence of any associated wound. He denies sick contacts and has not had recent antibiotic exposure. While he endorses chills, he denies any subjective fever per se. He presented for his regularly scheduled dialysis session and was noted to be febrile but reportedly  completed dialysis, otherwise without incident.  In ED, patient was found to be febrile to 39.5 C, tachycardic to the 110s, and with vitals otherwise stable. Chest x-ray was negative for acute cardiopulmonary disease and radiographs of the left foot were negative for acute changes. EKG demonstrated a sinus tachycardia with LVH by voltage criteria. CBC features a leukocytosis to 11,200 and lactic acid is elevated to 3.36. Urine was collected and analysis reveals few bacteria with negative nitrite and negative leukocyte. Patient was bolused with 1.5 L of normal saline in the emergency department and treated with empiric vancomycin and Zosyn after blood and urine cultures have been collected. He remained hemodynamically stable in the ED and will be admitted for ongoing evaluation and management of sepsis with source not yet identified.   Hospital Course:   Sepsis/MRSA bacteremia -Continue vancomycin and rifampin with a stop date of 01/31/16 per ID recommendations. -Repeat BC are negative at 60 hours. -Can receive abx with HD. -TEE negative for vegetations of native valves or AICD. -ID has recommended addition of rifampin to his 3 weeks of vanc. Stop date 3/18.  End-stage renal disease -on hemodialysis Tuesday Thursday Saturday. -Has been dialyzed on scheduled in the hospital.  Diabetes mellitus -Fair control, continue current regimen and adjust as needed.  Procedures:  TEE   Consultations:  Nephrology  Cardiology  Discharge Instructions  Discharge Instructions    Diet - low sodium heart healthy    Complete by:  As directed      Increase activity slowly    Complete by:  As directed  Medication List    TAKE these medications        acetaminophen 650 MG CR tablet  Commonly known as:  TYLENOL  Take 650 mg by mouth every 8 (eight) hours as needed for pain.     atorvastatin 10 MG tablet  Commonly known as:  LIPITOR  Take 10 mg by mouth daily.     calcium acetate  667 MG capsule  Commonly known as:  PHOSLO  Take 2,001 mg by mouth 3 (three) times daily with meals.     carvedilol 6.25 MG tablet  Commonly known as:  COREG  Take 6.25 mg by mouth 2 (two) times daily.     EFFIENT 10 MG Tabs tablet  Generic drug:  prasugrel  Take 10 mg by mouth daily.     gabapentin 300 MG capsule  Commonly known as:  NEURONTIN  Take 300 mg by mouth 3 (three) times daily.     hydrALAZINE 25 MG tablet  Commonly known as:  APRESOLINE  Take 25 mg by mouth 3 (three) times daily.     lidocaine-prilocaine cream  Commonly known as:  EMLA  APPLY SMALL AMOUNT TO ACCESS SITE 3 TIMES A WEEK 1 HOUR BEFORE DIALYSIS. COVER WITH OCCLUSIVE DRESSING (SARAN WRAP).     LORazepam 0.5 MG tablet  Commonly known as:  ATIVAN  Take 0.5 mg by mouth every 12 (twelve) hours as needed for anxiety.     omeprazole 20 MG capsule  Commonly known as:  PRILOSEC  Take 20 mg by mouth daily.     oxyCODONE-acetaminophen 10-325 MG tablet  Commonly known as:  PERCOCET  Take 1 tablet by mouth every 6 (six) hours as needed for pain.     ranitidine 150 MG capsule  Commonly known as:  ZANTAC  Take 150 mg by mouth daily.     RENAL 1 MG Caps  Take 1 capsule by mouth daily.     rifampin 300 MG capsule  Commonly known as:  RIFADIN  Take 2 capsules (600 mg total) by mouth daily.     traZODone 50 MG tablet  Commonly known as:  DESYREL  Take 50 mg by mouth at bedtime.     Vancomycin 750 MG/150ML Soln  Commonly known as:  VANCOCIN  Inject 150 mLs (750 mg total) into the vein Every Tuesday,Thursday,and Saturday with dialysis.       No Known Allergies     Follow-up Information    Follow up with [facility] .   Specialty:  Skilled Nursing Facility   Contact information:   [address] [place] [address] [phone]       The results of significant diagnostics from this hospitalization (including imaging, microbiology, ancillary and laboratory) are listed  below for reference.    Significant Diagnostic Studies: Dg Chest 2 View  01/08/2016  CLINICAL DATA:  Dialysis patient. Patient had dialysis today with flu like symptoms with cough and fever. EXAM: CHEST  2 VIEW COMPARISON:  11/27/2005 FINDINGS: Cardiac silhouette is mildly enlarged. No mediastinal or hilar masses or evidence of adenopathy. Left anterior chest wall cardioverter-defibrillator is stable. Clear lungs.  No pleural effusion or pneumothorax. Bony thorax is intact. IMPRESSION: No acute cardiopulmonary disease. Electronically Signed   By: Amie Portland M.D.   On: 01/08/2016 19:15   Dg Foot Complete Left  01/08/2016  CLINICAL DATA:  Patient has cough and fever and pain. EXAM: LEFT FOOT - COMPLETE 3+ VIEW COMPARISON:  None. FINDINGS: Patient is  status post prior amputation of the mid to distal left foot. There is diffuse osteopenia. Plantar calcaneal spur is identified. IMPRESSION: Chronic changes of the left foot. 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 Electronically Signed   By: Sherian Rein M.D.   On: 01/08/2016 19:15    Microbiology: Recent Results (from the past 240 hour(s))  Blood Culture (routine x 2)     Status: None   Collection Time: 01/08/16  5:15 PM  Result Value Ref Range Status   Specimen Description BLOOD LEFT ANTECUBITAL DRAWN BY RN  Final   Special Requests BOTTLES DRAWN AEROBIC AND ANAEROBIC 6CC EACH  Final   Culture  Setup Time   Final    GRAM POSITIVE COCCI IN CLUSTERS IN BOTH AEROBIC AND ANAEROBIC BOTTLES CRITICAL RESULT CALLED TO, READ BACK BY AND VERIFIED WITH: BULLIN,M AT 1009 ON 01/09/2016 BY WOODS,M    Culture   Final    METHICILLIN RESISTANT STAPHYLOCOCCUS AUREUS Performed at [facility]    Report Status 01/11/2016 FINAL  Final   Organism ID, Bacteria METHICILLIN RESISTANT STAPHYLOCOCCUS AUREUS  Final      Susceptibility   Methicillin resistant staphylococcus aureus - MIC*    CIPROFLOXACIN >=8 RESISTANT Resistant      ERYTHROMYCIN >=8 RESISTANT Resistant     GENTAMICIN >=16 RESISTANT Resistant     OXACILLIN >=4 RESISTANT Resistant     TETRACYCLINE <=1 SENSITIVE Sensitive     VANCOMYCIN <=0.5 SENSITIVE Sensitive     TRIMETH/SULFA >=320 RESISTANT Resistant     CLINDAMYCIN <=0.25 SENSITIVE Sensitive     RIFAMPIN <=0.5 SENSITIVE Sensitive     Inducible Clindamycin NEGATIVE Sensitive     * METHICILLIN RESISTANT STAPHYLOCOCCUS AUREUS  Blood Culture (routine x 2)     Status: None   Collection Time: 01/08/16  5:16 PM  Result Value Ref Range Status   Specimen Description BLOOD RIGHT HAND  Final   Special Requests BOTTLES DRAWN AEROBIC ONLY 8CC  Final   Culture  Setup Time   Final    GRAM POSITIVE COCCI IN CLUSTERS AEROBIC BOTTLE ONLY CRITICAL RESULT CALLED TO, READ BACK BY AND VERIFIED WITH: BULILNS, M. AT 1233 ON 01/09/2016 BY AGUNDIZ, E.    Culture   Final    STAPHYLOCOCCUS AUREUS SUSCEPTIBILITIES PERFORMED ON PREVIOUS CULTURE WITHIN THE LAST 5 DAYS. Performed at [facility]    Report Status 01/11/2016 FINAL  Final  Urine culture     Status: None   Collection Time: 01/08/16  5:19 PM  Result Value Ref Range Status   Specimen Description URINE, CLEAN CATCH  Final   Special Requests NONE  Final   Culture   Final    MULTIPLE SPECIES PRESENT, SUGGEST RECOLLECTION Performed at [facility]    Report Status 01/10/2016 FINAL  Final  MRSA PCR Screening     Status: Abnormal   Collection Time: 01/09/16 12:29 AM  Result Value Ref Range Status   MRSA by PCR POSITIVE (A) NEGATIVE Final    Comment:        The GeneXpert MRSA Assay (FDA approved for NASAL specimens only), is one component of a comprehensive MRSA colonization surveillance program. It is not intended to diagnose MRSA infection nor to guide or monitor treatment for MRSA infections. RESULT CALLED TO, READ BACK BY AND VERIFIED WITH: STURDIVANT D AT 0226 ON [id number] BY FORSYTH K   Culture, blood (routine x 2)     Status: None  (Preliminary result)  Collection Time: 01/10/16 11:18 AM  Result Value Ref Range Status   Specimen Description LEFT ANTECUBITAL  Final   Special Requests BOTTLES DRAWN AEROBIC AND ANAEROBIC 8CC  Final   Culture NO GROWTH 3 DAYS  Final   Report Status PENDING  Incomplete  Culture, blood (routine x 2)     Status: None (Preliminary result)   Collection Time: 01/10/16  3:00 PM  Result Value Ref Range Status   Specimen Description BLOOD RIGHT FOREARM  Final   Special Requests BOTTLES DRAWN AEROBIC AND ANAEROBIC 8CC EACH  Final   Culture NO GROWTH 3 DAYS  Final   Report Status PENDING  Incomplete     Labs: Basic Metabolic Panel:  Recent Labs Lab 01/08/16 1715 01/09/16 0233 01/10/16 0547 01/12/16 2005 01/13/16 0606  NA 135 134* 135 134* 138  K 4.7 3.5 4.0 4.2 4.0  CL 89* 95* 93* 96* 99*  CO2 32 GLUCOSE 192* 157* 106* 144* 101*  BUN 32* 41* 57* 47* 52*  CREATININE 5.00* 5.66* 7.03* 7.64* 7.98*  CALCIUM 10.1 9.0 9.4 9.2 9.3  PHOS  --   --   --  5.6* 5.6*   Liver Function Tests:  Recent Labs Lab 01/08/16 1715 01/12/16 2005 01/13/16 0606  AST 28  --   --   ALT 17  --   --   ALKPHOS 137*  --   --   BILITOT 0.9  --   --   PROT 9.2*  --   --   ALBUMIN 3.5 3.0* 2.8*   No results for input(s): LIPASE, AMYLASE in the last 168 hours. No results for input(s): AMMONIA in the last 168 hours. CBC:  Recent Labs Lab 01/08/16 1715 01/09/16 0233 01/10/16 0547 01/12/16 2005 01/13/16 0605  WBC 11.2* 14.6* 10.6* 4.2 3.9*  NEUTROABS 9.6* 12.3*  --   --   --   HGB 15.0 12.8* 12.9* 13.2 12.2*  HCT 43.4 37.6* 37.7* 38.1* 35.5*  MCV 94.6 95.4 94.3 94.1 94.4  PLT 141* 115* 125* 142* 149*   Cardiac Enzymes: No results for input(s): CKTOTAL, CKMB, CKMBINDEX, TROPONINI in the last 168 hours. BNP: BNP (last 3 results) No results for input(s): BNP in the last 8760 hours.  ProBNP (last 3 results) No results for input(s): PROBNP in the last 8760 hours.  CBG:  Recent  Labs Lab 01/12/16 1357 01/12/16 1646 01/12/16 2118 01/13/16 0713 01/13/16 1111  GLUCAP 88 132* 133* 95 100*       Signed:  HERNANDEZ ACOSTA,ESTELA  [facility] Pager: [phone] 01/13/2016, 2:14 PM

## 2016-01-13 NOTE — Addendum Note (Signed)
Addendum  created 01/13/16 1326 by Franco Nones, CRNA   Modules edited: Notes Section   Notes Section:  File: [id number]

## 2016-01-13 NOTE — Anesthesia Postprocedure Evaluation (Signed)
Anesthesia Post Note  Patient: Victor White  Procedure(s) Performed: Procedure(s) (LRB): TRANSESOPHAGEAL ECHOCARDIOGRAM (TEE) WITH PROPOFOL (N/A)  Patient location during evaluation: Nursing Unit Anesthesia Type: MAC Level of consciousness: awake and alert Pain management: pain level controlled Vital Signs Assessment: post-procedure vital signs reviewed and stable Respiratory status: spontaneous breathing Cardiovascular status: stable Anesthetic complications: no    Last Vitals:  Filed Vitals:   01/12/16 2119 01/13/16 0453  BP: 97/55 89/51  Pulse: 95 79  Temp: 36.6 C 36.7 C  Resp: 20 20    Last Pain:  Filed Vitals:   01/13/16 0454  PainSc: 0-No pain                 Odyssey Vasbinder

## 2016-01-13 NOTE — Progress Notes (Signed)
Subjective: Patient offers no complaints. He doesn't have any nausea or vomiting. Denies also any difficulty breathing. Patient asking me to go home.   Objective: Vital signs in last 24 hours: Temp:  [97.7 F (36.5 C)-98.1 F (36.7 C)] 98 F (36.7 C) (02/28 0453) Pulse Rate:  [72-95] 79 (02/28 0453) Resp:  [12-24] 20 (02/28 0453) BP: (89-143)/(51-69) 89/51 mmHg (02/28 0453) SpO2:  [94 %-100 %] 100 % (02/28 0453) Weight:  [177 lb 8 oz (80.513 kg)-178 lb (80.74 kg)] 177 lb 8 oz (80.513 kg) (02/28 0453)  Intake/Output from previous day: 02/27 0701 - 02/28 0700 In: 640 [P.O.:240; I.V.:400] Out: 300 [Urine:300] Intake/Output this shift:     Recent Labs  01/12/16 2005 01/13/16 0605  HGB 13.2 12.2*    Recent Labs  01/12/16 2005 01/13/16 0605  WBC 4.2 3.9*  RBC 4.05* 3.76*  HCT 38.1* 35.5*  PLT 142* 149*    Recent Labs  01/12/16 2005 01/13/16 0606  NA 134* 138  K 4.2 4.0  CL 96* 99*  CO2 26 28  BUN 47* 52*  CREATININE 7.64* 7.98*  GLUCOSE 144* 101*  CALCIUM 9.2 9.3   No results for input(s): LABPT, INR in the last 72 hours.  Generally patient is alert and in no apparent distress Chest is clear to auscultation His heart exam revealed regular rate and rhythm. No murmur Abdomen: Soft, nontender and positive bowel sound Extremities no edema  Assessment/Plan: Problem #1 end-stage dialysis: He is status post hemodialysis on Saturday. Patient remained asymptomatic. His potassium is normal and patient is due for dialysis today. Problem #2 hypertension: His blood pressure is reasonably controlled Problem #3 history of staph aureus bacteremia. Presently patient is afebrile. Patient is on antibiotics. TEE seems to be negative for vegetation and presently I did suggest to continue his antibiotics for 3 weeks. Problem #4 history of insulin dependent diabetes Problem #5 history ofA ICD placement Problem #6 anemia: His hemoglobin is above our target goal. Patient is not on  Epogen. Problem #7 metabolic bone disease: Calcium is range but phosphorus is 5.6 slightly high but stable. Plan: 1] We'll continue his antibiotics as suggested by ID. Patient needs to continue her other outpatient until her treatment is complete 2] We will make arrangements for patient to get dialysis today. His next dialysis is on Thursday. 3] we'll remove about 3 L if his blood pressure tolerates.  Geniene List S 01/13/2016, 8:28 AM

## 2016-01-13 NOTE — Procedures (Signed)
   HEMODIALYSIS TREATMENT NOTE:  3.5 hour heparin-free dialysis completed via right upper arm AVF (15g/antegrade). Goal nearly met: 2.9 liters removed without interruption in ultrafiltration. Pt chose to end session 30 min early/AMA. Vancomycin given at end of HD. All blood was returned and hemostasis was achieved within 15 minutes. Report called to Rosealee Albee, RN.  Rockwell Alexandria, RN, CDN

## 2016-01-13 NOTE — Progress Notes (Signed)
Pt. Will be transferred to [facility] [place], [place]) post dialysis treatment via EMS.

## 2016-01-14 ENCOUNTER — Encounter (HOSPITAL_COMMUNITY): Payer: Self-pay | Admitting: Cardiology

## 2016-01-15 LAB — CULTURE, BLOOD (ROUTINE X 2)
CULTURE: NO GROWTH
Culture: NO GROWTH

## 2016-04-08 ENCOUNTER — Encounter: Payer: Self-pay | Admitting: Internal Medicine

## 2016-04-14 ENCOUNTER — Ambulatory Visit: Payer: Medicare Other | Admitting: Internal Medicine

## 2016-04-16 ENCOUNTER — Encounter: Payer: Self-pay | Admitting: Internal Medicine

## 2016-07-15 NOTE — Progress Notes (Signed)
Cardiology Office Note   Date:  07/16/2016   ID:  Victor White, DOB 03-23-1966, MRN [id number]  PCP:  Marletta Lor, NP  Cardiologist:   new   Pt referred for continued cardiac care     History of Present Illness: Victor White is a 50 y.o. male with a history of IDDM, HTN, ESRD On dialysis .  Also has a history of MRSA bacteremia   Also a history of CAD, LVEF 15%  Pt was followed in [place]  Had stent placed  Cant remember when  Defib was placed about 1 year ago  No shock   Moved here from [facility]   Admitted in Feb 2017 for bacteremiea    No F/C  Breathing fair  Wakes up at night SOB  Dialysis going fine No CP   Doesn't remember if had CP at time of stent      Dr Rudi Rummage  At [facility] [place]  Outpatient Medications Prior to Visit  Medication Sig Dispense Refill  . acetaminophen (TYLENOL) 650 MG CR tablet Take 650 mg by mouth every 8 (eight) hours as needed for pain.    Marland Kitchen atorvastatin (LIPITOR) 10 MG tablet Take 10 mg by mouth daily.    . B Complex-C-Folic Acid (RENAL) 1 MG CAPS Take 1 capsule by mouth daily.    . calcium acetate (PHOSLO) 667 MG capsule Take 2,001 mg by mouth 3 (three) times daily with meals.    . carvedilol (COREG) 6.25 MG tablet Take 6.25 mg by mouth 2 (two) times daily.    Marland Kitchen gabapentin (NEURONTIN) 300 MG capsule Take 300 mg by mouth 3 (three) times daily.    . hydrALAZINE (APRESOLINE) 25 MG tablet Take 25 mg by mouth 3 (three) times daily.    Marland Kitchen lidocaine-prilocaine (EMLA) cream APPLY SMALL AMOUNT TO ACCESS SITE 3 TIMES A WEEK 1 HOUR BEFORE DIALYSIS. COVER WITH OCCLUSIVE DRESSING (SARAN WRAP).  3  . LORazepam (ATIVAN) 0.5 MG tablet Take 0.5 mg by mouth every 12 (twelve) hours as needed for anxiety.    Marland Kitchen omeprazole (PRILOSEC) 20 MG capsule Take 20 mg by mouth daily.    Marland Kitchen oxyCODONE-acetaminophen (PERCOCET) 10-325 MG tablet Take 1 tablet by mouth every 6 (six) hours as needed for pain.    . prasugrel (EFFIENT) 10 MG TABS tablet Take 10 mg by mouth  daily.    . ranitidine (ZANTAC) 150 MG capsule Take 150 mg by mouth daily.    . traZODone (DESYREL) 50 MG tablet Take 50 mg by mouth at bedtime.     No facility-administered medications prior to visit.      Allergies:   Review of patient's allergies indicates no known allergies.   Past Medical History:  Diagnosis Date  . CAD (coronary artery disease)   . Cardiomyopathy (HCC)   . Diabetic neuropathy (HCC)   . ESRD on hemodialysis (HCC)   . Essential hypertension   . ICD (implantable cardioverter-defibrillator) in place   . Type 2 diabetes mellitus (HCC)     Past Surgical History:  Procedure Laterality Date  . INSERT / REPLACE / REMOVE PACEMAKER    . TEE WITHOUT CARDIOVERSION N/A 01/12/2016   Procedure: TRANSESOPHAGEAL ECHOCARDIOGRAM (TEE) WITH PROPOFOL;  Surgeon: Jonelle Sidle, MD;  Location: [facility];  Service: Cardiovascular;  Laterality: N/A;  . TRANSMETATARSAL AMPUTATION Bilateral      Social History:  The patient  reports that he has been smoking Cigarettes.  He started smoking about 38  years ago. He has never used smokeless tobacco. He reports that he does not drink alcohol or use drugs.   Family History:  The patient's family history includes Diabetes type II in his other.    ROS:  Please see the history of present illness. All other systems are reviewed and  Negative to the above problem except as noted.    PHYSICAL EXAM: VS:  BP 98/62 (BP Location: Left Arm)   Pulse 65   Ht 6' (1.829 m)   Wt 178 lb (80.7 kg)   SpO2 95%   BMI 24.14 kg/m   GEN: Well nourished, well developed, in no acute distress  HEENT: normal  Neck: no JVD, or masses Cardiac: RRR; no murmurs, rubs, or gallops,no edema   Tr PT   Respiratory:  clear to auscultation bilaterally, normal work of breathing GI: soft, nontender, nondistended, + BS  No hepatomegaly  MS: s/p bilateral transmetatarsal amputation  Moving all extremities   Skin: warm and dry, no rash Neuro:  Strength and sensation  are intact Psych: euthymic mood, full affect   EKG:  EKG is not ordered today.  On 01/08/16 ST 119  LVH with repol abnormaltity     Lipid Panel No results found for: CHOL, TRIG, HDL, CHOLHDL, VLDL, LDLCALC, LDLDIRECT    Wt Readings from Last 3 Encounters:  07/16/16 178 lb (80.7 kg)  01/13/16 171 lb 11.8 oz (77.9 kg)      ASSESSMENT AND PLAN:  Pt is an unfortunate 50 yo with multiple medical problems  Referred by dialysis center for continued cardiac care   1.  Hx CAD  Pt does not remember details  [place] [facility] [place]  Will get records  No active symptoms   2  Chronic systolic CHF  LVEf 15% by echo in 2017  Volume status is not bad  I would keep on same regimen  Will get records esp about ICD  Pt needs to get established in EP clinic   3  ESRD  Gets dialysis [facility]  4.  HL  Need records  No labs today  5  DM  Pt says that he is no longer taking insulin Does not check glucose  6  Hx PVOD s/p ampuatations  Will get records  F/U tentatively in January  Set up in EP sooner       Signed, Victor PatesPaula Ifeoluwa Beller, MD  07/16/2016 2:10 PM    [facility] [address], [place], [place]  [address] Phone: [phone]; Fax: [phone]      Cardiology Office Note   Date:  07/16/2016   ID:  Victor White, DOB 08/05/1966, MRN [id number]  PCP:  Marletta LorBarr, Julie, NP  Cardiologist:   Victor PatesPaula Rylee Huestis, MD   No chief complaint on file.     History of Present Illness: Victor White is a 50 y.o. male with a history of       Outpatient Medications Prior to Visit  Medication Sig Dispense Refill  . acetaminophen (TYLENOL) 650 MG CR tablet Take 650 mg by mouth every 8 (eight) hours as needed for pain.    Marland Kitchen. atorvastatin (LIPITOR) 10 MG tablet Take 10 mg by mouth daily.    . B Complex-C-Folic Acid (RENAL) 1 MG CAPS Take 1 capsule by mouth daily.    . calcium acetate (PHOSLO) 667 MG capsule Take 2,001 mg by mouth 3 (three) times daily with meals.    . carvedilol (COREG)  6.25 MG tablet Take 6.25 mg by mouth 2 (two) times daily.    Marland Kitchen gabapentin (NEURONTIN) 300 MG capsule Take 300 mg by mouth 3 (three) times daily.    . hydrALAZINE (APRESOLINE) 25 MG tablet Take 25 mg by mouth 3 (three) times daily.    Marland Kitchen lidocaine-prilocaine (EMLA) cream APPLY SMALL AMOUNT TO ACCESS SITE 3 TIMES A WEEK 1 HOUR BEFORE DIALYSIS. COVER WITH OCCLUSIVE DRESSING (SARAN WRAP).  3  . LORazepam (ATIVAN) 0.5 MG tablet Take 0.5 mg by mouth every 12 (twelve) hours as needed for anxiety.    Marland Kitchen omeprazole (PRILOSEC) 20 MG capsule Take 20 mg by mouth daily.    Marland Kitchen oxyCODONE-acetaminophen (PERCOCET) 10-325 MG tablet Take 1 tablet by mouth every 6 (six) hours as needed for pain.    . prasugrel (EFFIENT) 10 MG TABS tablet Take 10 mg by mouth daily.    . ranitidine (ZANTAC) 150 MG capsule Take 150 mg by mouth daily.    . traZODone (DESYREL) 50 MG tablet Take 50 mg by mouth at bedtime.     No facility-administered medications prior to visit.      Allergies:   Review of patient's allergies indicates no known allergies.   Past Medical History:  Diagnosis Date  . CAD (coronary artery disease)   . Cardiomyopathy (HCC)   . Diabetic neuropathy (HCC)   . ESRD on hemodialysis (HCC)   . Essential hypertension   . ICD (implantable cardioverter-defibrillator) in place   . Type 2 diabetes mellitus (HCC)     Past Surgical History:  Procedure Laterality Date  . INSERT / REPLACE / REMOVE PACEMAKER    . TEE WITHOUT CARDIOVERSION N/A 01/12/2016   Procedure: TRANSESOPHAGEAL ECHOCARDIOGRAM (TEE) WITH PROPOFOL;  Surgeon: Jonelle Sidle, MD;  Location: [facility];  Service: Cardiovascular;  Laterality: N/A;  . TRANSMETATARSAL AMPUTATION Bilateral      Social History:  The patient  reports that he has been smoking Cigarettes.  He started smoking about 38 years ago. He has never used smokeless tobacco. He reports that he does not drink alcohol or use drugs.   Family History:  The patient's family history  includes Diabetes type II in his other.    ROS:  Please see the history of present illness. All other systems are reviewed and  Negative to the above problem except as noted.    PHYSICAL EXAM: VS:  BP 98/62 (BP Location: Left Arm)   Pulse 65   Ht 6' (1.829 m)   Wt 178 lb (80.7 kg)   SpO2 95%   BMI 24.14 kg/m   GEN: Well nourished, well developed, in no acute distress  HEENT: normal  Neck: no JVD, carotid bruits, or masses Cardiac: RRR; no murmurs, rubs, or gallops,no edema  Respiratory:  clear to auscultation bilaterally, normal work of breathing GI: soft, nontender, nondistended, + BS  No hepatomegaly  MS: s/p transmetarsal amputation bilaterally Skin:  dry, no rash Neuro:  Strength and sensation are intact Psych: euthymic mood, full affect   EKG:  EKG is not ordered today. On 01/08/16 ST 119 bpm  Sl ST depression laterally     Lipid Panel No results found for: CHOL, TRIG, HDL, CHOLHDL, VLDL, LDLCALC, LDLDIRECT    Wt Readings from Last 3 Encounters:  07/16/16 178 lb (80.7 kg)  01/13/16 171 lb 11.8 oz (77.9 kg)      ASSESSMENT AND PLAN:  Pt is a 50 yo with CAD, CHF (LVEF reported 15%), ESRD (on dialysis)  PResents  for establishing care. On exam volume status appears OK Need to get records from [facility].  Pt will need to get established for ICD f/u Continue on current meds   Get records from Dialysis center   2.  CHF  As noted   3.  ESRD  Get records  4.  PVOD  Set up for USN eval    5 DM  Get records on control   Signed, Victor Pates, MD  07/16/2016 2:10 PM    [facility] [address] [place], [place], [place]  [address] Phone: [phone]; Fax: [phone]

## 2016-07-16 ENCOUNTER — Encounter: Payer: Self-pay | Admitting: Internal Medicine

## 2016-07-16 ENCOUNTER — Ambulatory Visit (INDEPENDENT_AMBULATORY_CARE_PROVIDER_SITE_OTHER): Payer: Medicare Other | Admitting: Internal Medicine

## 2016-07-16 VITALS — BP 98/62 | HR 65 | Ht 72.0 in | Wt 178.0 lb

## 2016-07-16 DIAGNOSIS — E785 Hyperlipidemia, unspecified: Secondary | ICD-10-CM | POA: Diagnosis not present

## 2016-07-16 DIAGNOSIS — I5020 Unspecified systolic (congestive) heart failure: Secondary | ICD-10-CM

## 2016-07-16 DIAGNOSIS — I739 Peripheral vascular disease, unspecified: Secondary | ICD-10-CM

## 2016-07-16 DIAGNOSIS — N186 End stage renal disease: Secondary | ICD-10-CM

## 2016-07-16 NOTE — Patient Instructions (Signed)
Your physician recommends that you schedule a follow-up appointment in: device clinic after we get your records from [place]   Follow up in January with Dr Tenny Crawoss    Your physician recommends that you continue on your current medications as directed. Please refer to the Current Medication list given to you today.     Thank you for choosing [facility] !

## 2016-08-06 ENCOUNTER — Ambulatory Visit (INDEPENDENT_AMBULATORY_CARE_PROVIDER_SITE_OTHER): Payer: Medicare Other | Admitting: *Deleted

## 2016-08-06 DIAGNOSIS — I5020 Unspecified systolic (congestive) heart failure: Secondary | ICD-10-CM

## 2016-08-06 LAB — CUP PACEART INCLINIC DEVICE CHECK
HighPow Impedance: 36 Ohm
Lead Channel Impedance Value: 377 Ohm
Lead Channel Pacing Threshold Amplitude: 0.6 V
Lead Channel Pacing Threshold Pulse Width: 0.4 ms
Lead Channel Pacing Threshold Pulse Width: 0.4 ms
Lead Channel Setting Pacing Amplitude: 2 V
Lead Channel Setting Pacing Amplitude: 2.4 V
Lead Channel Setting Pacing Pulse Width: 0.4 ms
MDC IDC MSMT LEADCHNL RA PACING THRESHOLD AMPLITUDE: 0.7 V
MDC IDC MSMT LEADCHNL RA SENSING INTR AMPL: 3.7 mV
MDC IDC MSMT LEADCHNL RV IMPEDANCE VALUE: 439 Ohm
MDC IDC MSMT LEADCHNL RV SENSING INTR AMPL: 20 mV
MDC IDC PG SERIAL: 515753
MDC IDC SESS DTM: 20170922040000
MDC IDC SET LEADCHNL RV SENSING SENSITIVITY: 0.6 mV

## 2016-08-06 NOTE — Progress Notes (Signed)
ICD check in clinic. Normal device function. Thresholds and sensing consistent with previous device measurements. Impedance trends stable over time. (1) ventricular arrhythmia episode--no EGM. No mode switches. Histogram distribution appropriate for patient and level of activity. Outputs decreased to 2.0V/2.4V. Device programmed at appropriate safety margins. Device programmed to optimize intrinsic conduction. Estimated longevity 10.5 years. Pt will follow up with [facility] on 11-13 to establish care. Patient education completed including shock plan.

## 2016-09-27 ENCOUNTER — Encounter: Payer: Self-pay | Admitting: Internal Medicine

## 2016-09-27 ENCOUNTER — Encounter: Payer: Medicare Other | Admitting: Internal Medicine

## 2017-01-10 ENCOUNTER — Encounter: Payer: Self-pay | Admitting: Internal Medicine

## 2017-01-10 ENCOUNTER — Encounter: Payer: Medicare Other | Admitting: Internal Medicine

## 2017-01-25 ENCOUNTER — Encounter: Payer: Self-pay | Admitting: Vascular Surgery

## 2017-01-26 ENCOUNTER — Telehealth: Payer: Self-pay | Admitting: Cardiology

## 2017-01-26 ENCOUNTER — Encounter: Payer: Self-pay | Admitting: Vascular Surgery

## 2017-01-26 ENCOUNTER — Ambulatory Visit (INDEPENDENT_AMBULATORY_CARE_PROVIDER_SITE_OTHER): Payer: Medicare Other | Admitting: Vascular Surgery

## 2017-01-26 VITALS — BP 119/82 | HR 88 | Temp 97.4°F | Resp 18 | Ht 72.0 in | Wt 180.0 lb

## 2017-01-26 DIAGNOSIS — T829XXA Unspecified complication of cardiac and vascular prosthetic device, implant and graft, initial encounter: Secondary | ICD-10-CM | POA: Diagnosis not present

## 2017-01-26 NOTE — Progress Notes (Signed)
Referring Physician: Casimiro Needle, Md  Patient name: Victor White MRN: [id number] DOB: 01-28-1966 Sex: male  REASON FOR CONSULT: Bleeding from right arm AV fistula  HPI: Victor White is a 51 y.o. male with end-stage renal disease who currently dialyzes on Tuesday Thursday Saturday. He had an AV fistula placed in his right arm several years ago elsewhere. Recently he has developed an ulceration on the fistula and has had several other bleeding episodes from this. He is on presugrel.  An echocardiogram done back in February 2017 which showed an ejection fraction of 10-15%. All of his cardiac interventions previously were done elsewhere so I'm not sure why he is on presugrel. He also has an AICD. Other medical problems include diabetes hypertension both of which are currently stable.  Past Medical History:  Diagnosis Date  . CAD (coronary artery disease)   . Cardiomyopathy (HCC)   . Diabetic neuropathy (HCC)   . ESRD on hemodialysis (HCC)   . Essential hypertension   . ICD (implantable cardioverter-defibrillator) in place   . Type 2 diabetes mellitus (HCC)    Past Surgical History:  Procedure Laterality Date  . INSERT / REPLACE / REMOVE PACEMAKER    . TEE WITHOUT CARDIOVERSION N/A 01/12/2016   Procedure: TRANSESOPHAGEAL ECHOCARDIOGRAM (TEE) WITH PROPOFOL;  Surgeon: Jonelle Sidle, MD;  Location: [facility];  Service: Cardiovascular;  Laterality: N/A;  . TRANSMETATARSAL AMPUTATION Bilateral     Family History  Problem Relation Age of Onset  . Diabetes type II Other     SOCIAL HISTORY: Social History   Social History  . Marital status: Single    Spouse name: N/A  . Number of children: N/A  . Years of education: N/A   Occupational History  . Not on file.   Social History Main Topics  . Smoking status: Current Every Day Smoker    Packs/day: 0.25    Types: Cigarettes    Start date: 07/16/1978  . Smokeless tobacco: Never Used  . Alcohol use No  . Drug use: Yes    Types:  Marijuana  . Sexual activity: Not on file   Other Topics Concern  . Not on file   Social History Narrative  . No narrative on file    No Known Allergies  Current Outpatient Prescriptions  Medication Sig Dispense Refill  . acetaminophen (TYLENOL) 650 MG CR tablet Take 650 mg by mouth every 8 (eight) hours as needed for pain.    Marland Kitchen atorvastatin (LIPITOR) 10 MG tablet Take 10 mg by mouth daily.    . B Complex-C-Folic Acid (RENAL) 1 MG CAPS Take 1 capsule by mouth daily.    . calcium acetate (PHOSLO) 667 MG capsule Take 2,001 mg by mouth 3 (three) times daily with meals.    . carvedilol (COREG) 6.25 MG tablet Take 6.25 mg by mouth 2 (two) times daily.    Marland Kitchen gabapentin (NEURONTIN) 300 MG capsule Take 300 mg by mouth 3 (three) times daily.    . hydrALAZINE (APRESOLINE) 25 MG tablet Take 25 mg by mouth 3 (three) times daily.    Marland Kitchen lidocaine-prilocaine (EMLA) cream APPLY SMALL AMOUNT TO ACCESS SITE 3 TIMES A WEEK 1 HOUR BEFORE DIALYSIS. COVER WITH OCCLUSIVE DRESSING (SARAN WRAP).  3  . LORazepam (ATIVAN) 0.5 MG tablet Take 0.5 mg by mouth every 12 (twelve) hours as needed for anxiety.    Marland Kitchen omeprazole (PRILOSEC) 20 MG capsule Take 20 mg by mouth daily.    Marland Kitchen oxyCODONE-acetaminophen (PERCOCET) 10-325  MG tablet Take 1 tablet by mouth every 6 (six) hours as needed for pain.    . prasugrel (EFFIENT) 10 MG TABS tablet Take 10 mg by mouth daily.    . ranitidine (ZANTAC) 150 MG capsule Take 150 mg by mouth daily.    . traZODone (DESYREL) 50 MG tablet Take 50 mg by mouth at bedtime.     No current facility-administered medications for this visit.     ROS:   General:  No weight loss, Fever, chills Cardiac: No recent episodes of chest pain/pressure, no shortness of breath at rest.  No shortness of breath with exertion.  Denies history of atrial fibrillation or irregular heartbeat  Pulmonary: No home oxygen, no productive cough, no hemoptysis,  No asthma or wheezing   Physical Examination  Vitals:     01/26/17 1556  BP: 119/82  Pulse: 88  Resp: 18  Temp: 97.4 F (36.3 C)  TempSrc: Oral  SpO2: 96%  Weight: 180 lb (81.6 kg)  Height: 6' (1.829 m)    Body mass index is 24.41 kg/m.  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 He also has an additional aneurysmal segment proximal to this but the skin is not really thinned out over it.  There is an easily palpable thrill within the fistula. 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   PLAN:  Patient needs revision of the AV fistula with plication and excision of the ulcer. We'll schedule this for early next week. He is on Effient and we will need to discuss with cardiology stopping this and indications behind it.  I spoke with Dr. Ival BibleMcDowell's and Dr. Charlott Rakesoss's office today neither of which were unavailable this will need to be determined prior to his operation. It is tentatively scheduled for March 26.   Fabienne Brunsharles Fields, MD [facility] Office: [phone] Pager: [phone]

## 2017-01-26 NOTE — Telephone Encounter (Signed)
Received telephone call from Dr. Darrick PennaFields- He is requesting information on patient's prasugrel (EFFIENT) 10 MG TABS   .  He needs to know if patient can come off of this medication for an upcoming surgical procedure scheduled for next week.  Please call [phone]

## 2017-01-26 NOTE — Telephone Encounter (Signed)
Note sent to Dr Darrick PennaFields

## 2017-01-26 NOTE — Telephone Encounter (Signed)
This is not my patient. This is a patient of Dr. Tenny Crawoss. My name is in the chart because I had done a TEE on him at one point, but I am not familiar with his cardiac status.

## 2017-01-26 NOTE — Telephone Encounter (Signed)
Dr Darrick PennaFields called back says that pt had been waiting in the office for an answer - apologized and relayed that we were not aware that the pt was waiting for an answer and that we have forwarded message to Dr Tenny Crawoss and gave [facility].

## 2017-01-27 ENCOUNTER — Other Ambulatory Visit: Payer: Self-pay | Admitting: *Deleted

## 2017-02-07 ENCOUNTER — Ambulatory Visit: Admit: 2017-02-07 | Payer: Medicare Other | Admitting: Vascular Surgery

## 2017-02-07 SURGERY — REVISON OF ARTERIOVENOUS FISTULA
Anesthesia: Choice | Laterality: Right

## 2017-02-13 DEATH — deceased

## 2018-01-10 IMAGING — DX DG CHEST 2V
2 series · 2 of 2 positions shown · non-contrast
Comparison: 11/27/2005

CLINICAL DATA: Dialysis patient. Patient had dialysis today with
flu like symptoms with cough and fever.

EXAM:
CHEST  2 VIEW

[chest lat]
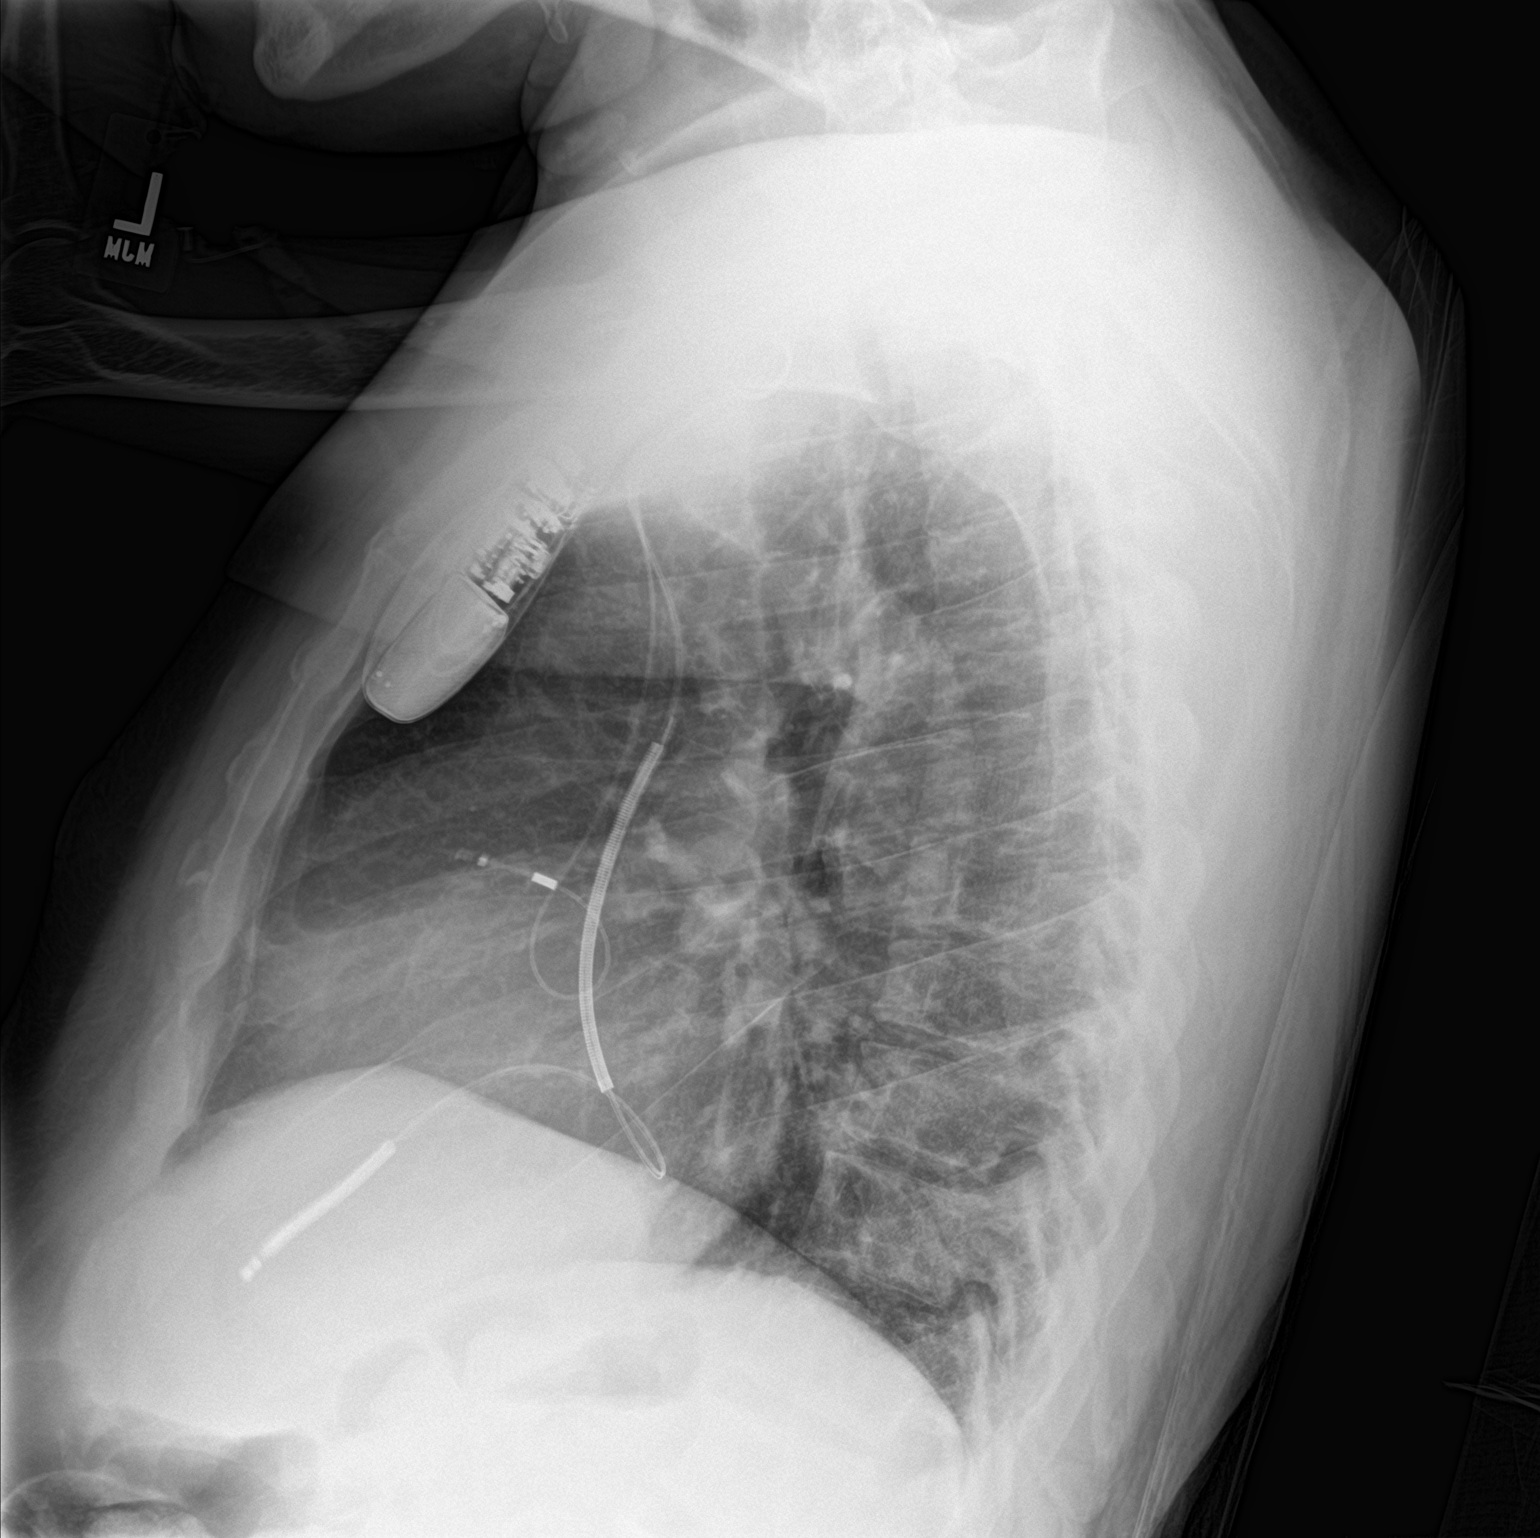

[chest ap]
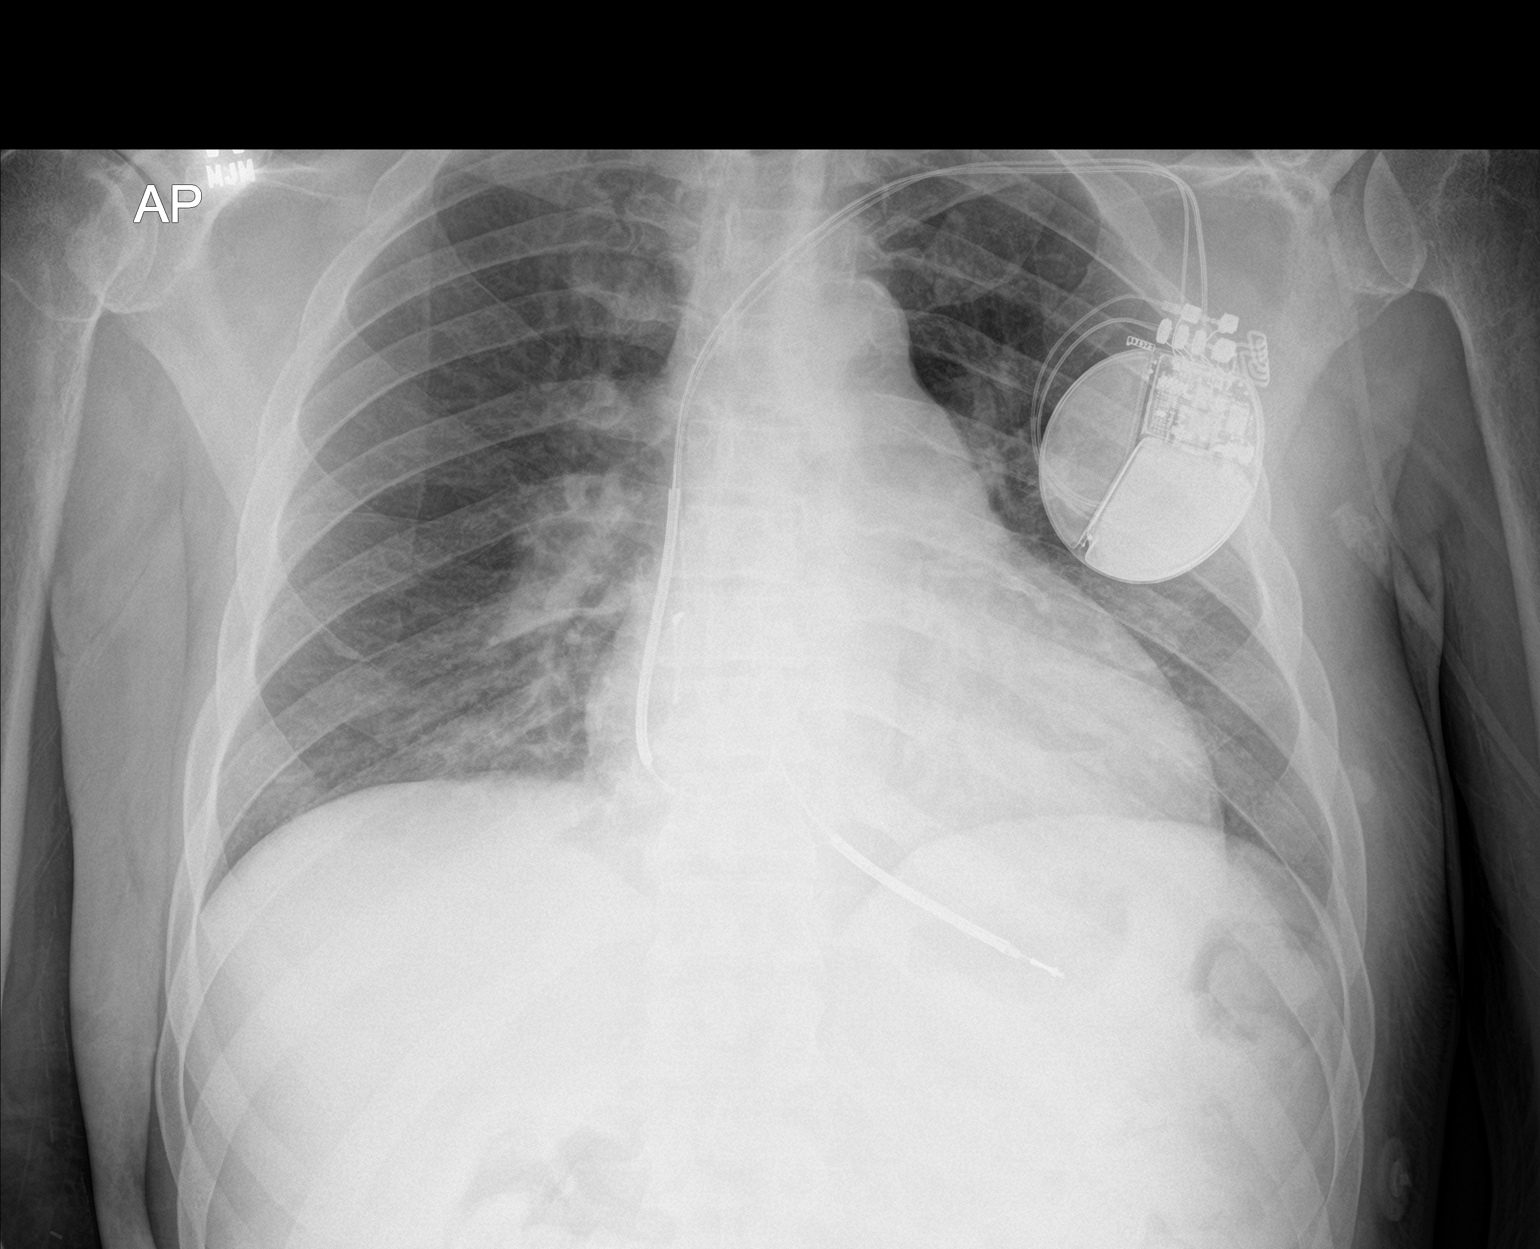

[2 of 2 positions shown; findings below may reference images not displayed]

FINDINGS: Cardiac silhouette is mildly enlarged. No mediastinal or hilar
masses or evidence of adenopathy. Left anterior chest wall
cardioverter-defibrillator is stable.

Clear lungs.  No pleural effusion or pneumothorax.

Bony thorax is intact.
IMPRESSION: No acute cardiopulmonary disease.
